# Patient Record
Sex: Female | Born: 1965 | Race: White | Hispanic: No | Marital: Married | Smoking: Never smoker
Health system: Southern US, Community
[De-identification: ages and names within clinical notes are randomized; demographics above are authoritative.]

## PROBLEM LIST (undated history)

## (undated) DIAGNOSIS — E039 Hypothyroidism, unspecified: Secondary | ICD-10-CM

## (undated) DIAGNOSIS — E559 Vitamin D deficiency, unspecified: Secondary | ICD-10-CM

## (undated) DIAGNOSIS — M1990 Unspecified osteoarthritis, unspecified site: Secondary | ICD-10-CM

## (undated) DIAGNOSIS — J45909 Unspecified asthma, uncomplicated: Secondary | ICD-10-CM

## (undated) HISTORY — DX: Vitamin D deficiency, unspecified: E55.9

## (undated) HISTORY — DX: Hypothyroidism, unspecified: E03.9

---

## 2010-12-23 HISTORY — PX: BREAST BIOPSY: SHX20

## 2015-08-25 ENCOUNTER — Other Ambulatory Visit: Payer: Self-pay | Admitting: Medical

## 2015-08-25 DIAGNOSIS — N644 Mastodynia: Secondary | ICD-10-CM

## 2015-09-25 ENCOUNTER — Ambulatory Visit
Admission: RE | Admit: 2015-09-25 | Discharge: 2015-09-25 | Disposition: A | Discharge status: Home / Self Care | Payer: BLUE CROSS/BLUE SHIELD | Source: Ambulatory Visit | Attending: Medical | Admitting: Medical

## 2015-09-25 DIAGNOSIS — N63 Unspecified lump in breast: Secondary | ICD-10-CM | POA: Insufficient documentation

## 2015-09-25 DIAGNOSIS — N644 Mastodynia: Secondary | ICD-10-CM | POA: Diagnosis not present

## 2015-12-31 ENCOUNTER — Emergency Department (HOSPITAL_COMMUNITY)
Admission: EM | Admit: 2015-12-31 | Discharge: 2015-12-31 | Disposition: A | Discharge status: Home / Self Care | Payer: BLUE CROSS/BLUE SHIELD | Attending: Emergency Medicine | Admitting: Emergency Medicine

## 2015-12-31 ENCOUNTER — Encounter (HOSPITAL_COMMUNITY): Payer: Self-pay | Admitting: Emergency Medicine

## 2015-12-31 ENCOUNTER — Emergency Department (HOSPITAL_COMMUNITY): Payer: BLUE CROSS/BLUE SHIELD

## 2015-12-31 DIAGNOSIS — Z8739 Personal history of other diseases of the musculoskeletal system and connective tissue: Secondary | ICD-10-CM | POA: Insufficient documentation

## 2015-12-31 DIAGNOSIS — R079 Chest pain, unspecified: Secondary | ICD-10-CM | POA: Insufficient documentation

## 2015-12-31 DIAGNOSIS — J45909 Unspecified asthma, uncomplicated: Secondary | ICD-10-CM | POA: Insufficient documentation

## 2015-12-31 DIAGNOSIS — J069 Acute upper respiratory infection, unspecified: Secondary | ICD-10-CM

## 2015-12-31 HISTORY — DX: Unspecified osteoarthritis, unspecified site: M19.90

## 2015-12-31 HISTORY — DX: Unspecified asthma, uncomplicated: J45.909

## 2015-12-31 LAB — CBC WITH DIFFERENTIAL/PLATELET
BASOS ABS: 0 10*3/uL (ref 0.0–0.1)
Basophils Relative: 0 %
Eosinophils Absolute: 0.2 10*3/uL (ref 0.0–0.7)
Eosinophils Relative: 7 %
HCT: 37.6 % (ref 36.0–46.0)
HEMOGLOBIN: 12.4 g/dL (ref 12.0–15.0)
LYMPHS ABS: 0.6 10*3/uL — AB (ref 0.7–4.0)
LYMPHS PCT: 24 %
MCH: 31.1 pg (ref 26.0–34.0)
MCHC: 33 g/dL (ref 30.0–36.0)
MCV: 94.2 fL (ref 78.0–100.0)
Monocytes Absolute: 0.4 10*3/uL (ref 0.1–1.0)
Monocytes Relative: 15 %
NEUTROS PCT: 54 %
Neutro Abs: 1.2 10*3/uL — ABNORMAL LOW (ref 1.7–7.7)
PLATELETS: 206 10*3/uL (ref 150–400)
RBC: 3.99 MIL/uL (ref 3.87–5.11)
RDW: 12.1 % (ref 11.5–15.5)
WBC: 2.3 10*3/uL — AB (ref 4.0–10.5)

## 2015-12-31 LAB — I-STAT CHEM 8, ED
BUN: 6 mg/dL (ref 6–20)
CALCIUM ION: 1.13 mmol/L (ref 1.12–1.23)
Chloride: 101 mmol/L (ref 101–111)
Creatinine, Ser: 0.6 mg/dL (ref 0.44–1.00)
Glucose, Bld: 112 mg/dL — ABNORMAL HIGH (ref 65–99)
HEMATOCRIT: 38 % (ref 36.0–46.0)
Hemoglobin: 12.9 g/dL (ref 12.0–15.0)
POTASSIUM: 3.9 mmol/L (ref 3.5–5.1)
Sodium: 137 mmol/L (ref 135–145)
TCO2: 25 mmol/L (ref 0–100)

## 2015-12-31 LAB — COMPREHENSIVE METABOLIC PANEL
ALBUMIN: 3.8 g/dL (ref 3.5–5.0)
ALT: 18 U/L (ref 14–54)
AST: 21 U/L (ref 15–41)
Alkaline Phosphatase: 55 U/L (ref 38–126)
Anion gap: 11 (ref 5–15)
BILIRUBIN TOTAL: 0.5 mg/dL (ref 0.3–1.2)
BUN: 6 mg/dL (ref 6–20)
CHLORIDE: 100 mmol/L — AB (ref 101–111)
CO2: 25 mmol/L (ref 22–32)
CREATININE: 0.58 mg/dL (ref 0.44–1.00)
Calcium: 8.8 mg/dL — ABNORMAL LOW (ref 8.9–10.3)
GFR calc Af Amer: >60 mL/min (ref >60)
GLUCOSE: 116 mg/dL — AB (ref 65–99)
POTASSIUM: 3.9 mmol/L (ref 3.5–5.1)
Sodium: 136 mmol/L (ref 135–145)
Total Protein: 6.4 g/dL — ABNORMAL LOW (ref 6.5–8.1)

## 2015-12-31 LAB — I-STAT TROPONIN, ED: TROPONIN I, POC: 0 ng/mL (ref 0.00–0.08)

## 2015-12-31 LAB — D-DIMER, QUANTITATIVE (NOT AT ARMC)

## 2015-12-31 MED ORDER — IBUPROFEN 800 MG PO TABS: 800.0000 mg | ORAL_TABLET | Freq: Once | ORAL | Status: DC

## 2015-12-31 NOTE — Discharge Instructions (Signed)
Upper Respiratory Infection, Adult Most upper respiratory infections (URIs) are caused by a virus. A URI affects the nose, throat, and upper air passages. The most common type of URI is often called "the common cold." HOME CARE   Take medicines only as told by your doctor.  Gargle warm saltwater or take cough drops to comfort your throat as told by your doctor.  Use a warm mist humidifier or inhale steam from a shower to increase air moisture. This may make it easier to breathe.  Drink enough fluid to keep your pee (urine) clear or pale yellow.  Eat soups and other clear broths.  Have a healthy diet.  Rest as needed.  Go back to work when your fever is gone or your doctor says it is okay.  You may need to stay home longer to avoid giving your URI to others.  You can also wear a face mask and wash your hands often to prevent spread of the virus.  Use your inhaler more if you have asthma.  Do not use any tobacco products, including cigarettes, chewing tobacco, or electronic cigarettes. If you need help quitting, ask your doctor. GET HELP IF:  You are getting worse, not better.  Your symptoms are not helped by medicine.  You have chills.  You are getting more short of breath.  You have brown or red mucus.  You have yellow or brown discharge from your nose.  You have pain in your face, especially when you bend forward.  You have a fever.  You have puffy (swollen) neck glands.  You have pain while swallowing.  You have white areas in the back of your throat. GET HELP RIGHT AWAY IF:   You have very bad or constant:  Headache.  Ear pain.  Pain in your forehead, behind your eyes, and over your cheekbones (sinus pain).  Chest pain.  You have long-lasting (chronic) lung disease and any of the following:  Wheezing.  Long-lasting cough.  Coughing up blood.  A change in your usual mucus.  You have a stiff neck.  You have changes in  your:  Vision.  Hearing.  Thinking.  Mood. MAKE SURE YOU:   Understand these instructions.  Will watch your condition.  Will get help right away if you are not doing well or get worse.   This information is not intended to replace advice given to you by your health care provider. Make sure you discuss any questions you have with your health care provider.   Document Released: 05/27/2008 Document Revised: 04/25/2015 Document Reviewed: 03/16/2014 Elsevier Interactive Patient Education 2016 Elsevier Inc. Nonspecific Chest Pain It is often hard to find the cause of chest pain. There is always a chance that your pain could be related to something serious, such as a heart attack or a blood clot in your lungs. Chest pain can also be caused by conditions that are not life-threatening. If you have chest pain, it is very important to follow up with your doctor.  HOME CARE  If you were prescribed an antibiotic medicine, finish it all even if you start to feel better.  Avoid any activities that cause chest pain.  Do not use any tobacco products, including cigarettes, chewing tobacco, or electronic cigarettes. If you need help quitting, ask your doctor.  Do not drink alcohol.  Take medicines only as told by your doctor.  Keep all follow-up visits as told by your doctor. This is important. This includes any further testing if your  chest pain does not go away.  Your doctor may tell you to keep your head raised (elevated) while you sleep.  Make lifestyle changes as told by your doctor. These may include:  Getting regular exercise. Ask your doctor to suggest some activities that are safe for you.  Eating a heart-healthy diet. Your doctor or a diet specialist (dietitian) can help you to learn healthy eating options.  Maintaining a healthy weight.  Managing diabetes, if necessary.  Reducing stress. GET HELP IF:  Your chest pain does not go away, even after treatment.  You have a  rash with blisters on your chest.  You have a fever. GET HELP RIGHT AWAY IF:  Your chest pain is worse.  You have an increasing cough, or you cough up blood.  You have severe belly (abdominal) pain.  You feel extremely weak.  You pass out (faint).  You have chills.  You have sudden, unexplained chest discomfort.  You have sudden, unexplained discomfort in your arms, back, neck, or jaw.  You have shortness of breath at any time.  You suddenly start to sweat, or your skin gets clammy.  You feel nauseous.  You vomit.  You suddenly feel light-headed or dizzy.  Your heart begins to beat quickly, or it feels like it is skipping beats. These symptoms may be an emergency. Do not wait to see if the symptoms will go away. Get medical help right away. Call your local emergency services (911 in the [place] Do not drive yourself to the hospital.   This information is not intended to replace advice given to you by your health care provider. Make sure you discuss any questions you have with your health care provider.   Document Released: 05/27/2008 Document Revised: 12/30/2014 Document Reviewed: 07/15/2014 Elsevier Interactive Patient Education [employer]2016 Elsevier Inc.

## 2015-12-31 NOTE — ED Notes (Signed)
Pt reports headache, URI s/s with fever Wednesday, reports CP beginning Saturday.  Pt reports pain across front of chest, not worse with movement or coughing.  Pt denies N/V, LOC, dizziness.  Dr. Rosalia Hammersay at bedside.

## 2015-12-31 NOTE — ED Provider Notes (Signed)
CSN: [id number]     Arrival date & time 12/31/15  1023 History   First MD Initiated Contact with Patient 12/31/15 1037     Chief Complaint  Patient presents with  . Chest Pain  . URI  . Cough     (Consider location/radiation/quality/duration/timing/severity/associated sxs/prior Treatment) HPI  50 year old female previously healthy presents today complaining of cough and chest pain. She began having upper respiratory infection symptoms 4 days ago that began with nasal congestion and sore throat. The next day be she began having some cough and had a mildly elevated temperature at 99. Cough has been nonproductive and she has not been dyspneic. She has not had nausea vomiting or diarrhea but has had a decreased appetite. She has been taking in liquids without difficulty. Urination has been normal. She is not a smoker. She has not had a flu shot. She was recently on an overseas flight after visiting family for the holidays. She states that both her legs were swollen afterwards but has not noticed any lateralized swelling. The initial swelling after the flight resolved. She has no history of DVT or PE.  Past Medical History  Diagnosis Date  . Arthritis   . Asthma    Past Surgical History  Procedure Laterality Date  . Breast biopsy Bilateral 2012    benign   No family history on file. Social History  Substance Use Topics  . Smoking status: Never Smoker   . Smokeless tobacco: Never Used  . Alcohol Use: 4.2 oz/week    7 Glasses of wine per week   OB History    No data available     Review of Systems  All other systems reviewed and are negative.     Allergies  Review of patient's allergies indicates not on file.  Home Medications   Prior to Admission medications   Not on File   BP 119/77 mmHg  Pulse 101  Temp(Src) 98.9 F (37.2 C) (Oral)  Resp 12  Ht 5\' 4"  (1.626 m)  Wt 54.432 kg  BMI 20.59 kg/m2  SpO2 99%  LMP 11/14/2015 (Approximate) Physical Exam  Constitutional:  She is oriented to person, place, and time. She appears well-developed and well-nourished.  HENT:  Head: Normocephalic and atraumatic.  Right Ear: External ear normal.  Left Ear: External ear normal.  Nose: Nose normal.  Mouth/Throat: Oropharynx is clear and moist.  Eyes: Conjunctivae and EOM are normal. Pupils are equal, round, and reactive to light.  Neck: Normal range of motion. Neck supple.  Cardiovascular: Normal rate, regular rhythm, normal heart sounds and intact distal pulses.   Pulmonary/Chest: Effort normal and breath sounds normal. She exhibits no tenderness.  Abdominal: Soft. Bowel sounds are normal.  Musculoskeletal: Normal range of motion. She exhibits no edema or tenderness.  Neurological: She is alert and oriented to person, place, and time.  Skin: Skin is warm and dry.  Psychiatric: She has a normal mood and affect. Her behavior is normal. Judgment and thought content normal.  Nursing note and vitals reviewed.   ED Course  Procedures (including critical care time) Labs Review Labs Reviewed  CBC WITH DIFFERENTIAL/PLATELET  D-DIMER, QUANTITATIVE (NOT AT [facility])  COMPREHENSIVE METABOLIC PANEL  I-STAT TROPOININ, ED    Imaging Review Dg Chest 2 View  12/31/2015  CLINICAL DATA:  Patient with dry cough and chest pain. EXAM: CHEST  2 VIEW COMPARISON:  None. FINDINGS: Normal cardiac and mediastinal contours. No consolidative pulmonary opacities. No pleural effusion or pneumothorax. Regional skeleton is unremarkable.  IMPRESSION: No active cardiopulmonary disease. Electronically Signed   By: Annia Beltrew  Davis M.D.   On: 12/31/2015 11:37   I have personally reviewed and evaluated these images and lab results as part of my medical decision-making.   EKG Interpretation   Date/Time:  Sunday December 31 2015 10:32:43 EST Ventricular Rate:  97 PR Interval:  116 QRS Duration: 94 QT Interval:  341 QTC Calculation: 433 R Axis:   90 Text Interpretation:  Normal sinus rhythm  Non-specific ST-t changes  Confirmed by Ailah Barna MD, Jozi Malachi ([id number]) on 12/31/2015 10:38:05 AM      MDM   Final diagnoses:  URI (upper respiratory infection)  Chest pain, unspecified chest pain type    50 year old female with URI symptoms. Chest x-Cielo Arias shows no evidence of acute infiltrate. Labs are normal including a normal d-dimer and troponin. EKG shows no evidence of acute ischemia. Patient has had the pain with extended episodes of up to 8 hours for greater than 24 hours. Given that the pain is associated with her bronchitis symptoms and she has had an extended episode of pain with normal troponin, doubt that this is cardiac in etiology. She was also recently on a long trip and she had a d-dimer checked. This is negative. Patient is given Protonix and ibuprofen. I we have discussed return precautions and need for close follow-up and she voices understanding.    Margarita Grizzleanielle Cassell [place]ies, MD 12/31/15 1236

## 2016-07-04 DIAGNOSIS — S91111A Laceration without foreign body of right great toe without damage to nail, initial encounter: Secondary | ICD-10-CM | POA: Diagnosis not present

## 2016-07-15 ENCOUNTER — Other Ambulatory Visit: Payer: Self-pay | Admitting: Internal Medicine

## 2016-07-15 ENCOUNTER — Ambulatory Visit
Admission: RE | Admit: 2016-07-15 | Discharge: 2016-07-15 | Disposition: A | Discharge status: Home / Self Care | Payer: BLUE CROSS/BLUE SHIELD | Source: Ambulatory Visit | Attending: Internal Medicine | Admitting: Internal Medicine

## 2016-07-15 DIAGNOSIS — M25561 Pain in right knee: Secondary | ICD-10-CM | POA: Diagnosis not present

## 2016-07-15 DIAGNOSIS — M25559 Pain in unspecified hip: Secondary | ICD-10-CM | POA: Diagnosis not present

## 2016-07-15 DIAGNOSIS — G8929 Other chronic pain: Secondary | ICD-10-CM | POA: Diagnosis not present

## 2016-07-15 DIAGNOSIS — M25549 Pain in joints of unspecified hand: Secondary | ICD-10-CM | POA: Diagnosis not present

## 2016-07-15 DIAGNOSIS — M25552 Pain in left hip: Secondary | ICD-10-CM | POA: Diagnosis not present

## 2016-07-15 DIAGNOSIS — M179 Osteoarthritis of knee, unspecified: Secondary | ICD-10-CM | POA: Diagnosis not present

## 2016-07-31 ENCOUNTER — Other Ambulatory Visit (HOSPITAL_COMMUNITY)
Admission: RE | Admit: 2016-07-31 | Discharge: 2016-07-31 | Disposition: A | Discharge status: Home / Self Care | Payer: BLUE CROSS/BLUE SHIELD | Source: Ambulatory Visit | Attending: Internal Medicine | Admitting: Internal Medicine

## 2016-07-31 ENCOUNTER — Other Ambulatory Visit: Payer: Self-pay | Admitting: Internal Medicine

## 2016-07-31 DIAGNOSIS — Z0000 Encounter for general adult medical examination without abnormal findings: Secondary | ICD-10-CM | POA: Diagnosis not present

## 2016-07-31 DIAGNOSIS — Z1151 Encounter for screening for human papillomavirus (HPV): Secondary | ICD-10-CM | POA: Diagnosis not present

## 2016-07-31 DIAGNOSIS — Z01419 Encounter for gynecological examination (general) (routine) without abnormal findings: Secondary | ICD-10-CM | POA: Diagnosis not present

## 2016-07-31 DIAGNOSIS — E039 Hypothyroidism, unspecified: Secondary | ICD-10-CM | POA: Diagnosis not present

## 2016-08-05 LAB — CYTOLOGY - PAP

## 2016-08-22 DIAGNOSIS — J4520 Mild intermittent asthma, uncomplicated: Secondary | ICD-10-CM | POA: Diagnosis not present

## 2016-08-22 DIAGNOSIS — R0982 Postnasal drip: Secondary | ICD-10-CM | POA: Diagnosis not present

## 2016-09-13 DIAGNOSIS — E039 Hypothyroidism, unspecified: Secondary | ICD-10-CM | POA: Diagnosis not present

## 2016-10-08 DIAGNOSIS — L659 Nonscarring hair loss, unspecified: Secondary | ICD-10-CM | POA: Diagnosis not present

## 2016-10-08 DIAGNOSIS — E559 Vitamin D deficiency, unspecified: Secondary | ICD-10-CM | POA: Diagnosis not present

## 2016-10-08 DIAGNOSIS — E039 Hypothyroidism, unspecified: Secondary | ICD-10-CM | POA: Diagnosis not present

## 2016-10-22 ENCOUNTER — Ambulatory Visit (INDEPENDENT_AMBULATORY_CARE_PROVIDER_SITE_OTHER): Payer: BLUE CROSS/BLUE SHIELD | Admitting: Endocrinology

## 2016-10-22 ENCOUNTER — Encounter: Payer: Self-pay | Admitting: Endocrinology

## 2016-10-22 DIAGNOSIS — E559 Vitamin D deficiency, unspecified: Secondary | ICD-10-CM

## 2016-10-22 DIAGNOSIS — E039 Hypothyroidism, unspecified: Secondary | ICD-10-CM | POA: Diagnosis not present

## 2016-10-22 DIAGNOSIS — L659 Nonscarring hair loss, unspecified: Secondary | ICD-10-CM | POA: Diagnosis not present

## 2016-10-22 NOTE — Progress Notes (Signed)
Subjective:    Patient ID: Virginia Fowler, female    DOB: August 21, 1966, 50 y.o.   MRN: [id number]  HPI Pt reports hypothyroidism was dx'ed in mid-2017, on a routine physical.  she was prescribed synthroid 75/d.  she has never taken kelp or any other type of non-prescribed thyroid product.  she has never had thyroid imaging.  She has never had thyroid surgery, or XRT to the neck.  she has never been on amiodarone or lithium.  Since on the synthroid, pt developed moderate hair loss from the head, and assoc anxiety.  The hair loss has persisted, despite other sxs improving.  In early Oct, 2017, she stopped the medication altogether, then resumed at 50 mcg/d.  She says in general, she feels better on 50/d than 75/d.   Past Medical History:  Diagnosis Date  . Arthritis   . Asthma   . Hypothyroidism   . Vitamin D deficiency     Past Surgical History:  Procedure Laterality Date  . BREAST BIOPSY Bilateral 2012   benign    Social History   Social History  . Marital status: Married    Spouse name: N/A  . Number of children: N/A  . Years of education: N/A   Occupational History  . Not on file.   Social History Main Topics  . Smoking status: Never Smoker  . Smokeless tobacco: Never Used  . Alcohol use 4.2 oz/week    7 Glasses of wine per week  . Drug use: No  . Sexual activity: Not on file   Other Topics Concern  . Not on file   Social History Narrative  . No narrative on file    No current outpatient prescriptions on file prior to visit.   No current facility-administered medications on file prior to visit.     No Known Allergies  Family History  Problem Relation Age of Onset  . Thyroid disease Neg Hx     BP 102/64   Pulse 98   Ht 5' 4.5" (1.638 m)   Wt 117 lb (53.1 kg)   SpO2 96%   BMI 19.77 kg/m    Review of Systems denies depression, muscle cramps, sob, weight gain, numbness, blurry vision, cold intolerance, dry skin, rhinorrhea, and syncope.  She has  intermittent palpitations, easy bruising, and difficulty forming words.  She has chronic alternating constipation and diarrhea.      Objective:   Physical Exam VS: see vs page GEN: no distress HEAD: head: no deformity eyes: no periorbital swelling, no proptosis external nose and ears are normal mouth: no lesion seen NECK: supple, thyroid is not enlarged CHEST WALL: no deformity LUNGS: clear to auscultation CV: reg rate and rhythm, no murmur ABD: abdomen is soft, nontender.  no hepatosplenomegaly.  not distended.  no hernia MUSCULOSKELETAL: muscle bulk and strength are grossly normal.  no obvious joint swelling.  gait is normal and steady EXTEMITIES: Trace bilat leg edema PULSES: no carotid bruit NEURO:  cn 2-12 grossly intact.   readily moves all 4's.  sensation is intact to touch on all 4's SKIN:  Normal texture and temperature.  No rash or suspicious lesion is visible.   NODES:  None palpable at the neck PSYCH: alert, well-oriented.  Does not appear anxious nor depressed.   I have reviewed outside records, and summarized: Pt was noted to have hypothyroidism.  It was noted that pt had a response in her TSH from 63-2.3, with supplementation.  She did not tolerate 75 mcg/d (  hair loss)  outside test results are reviewed: TSH=63 (07/31/16 TSH=2.6 (09/13/16)     Assessment & Plan:  Primary hypothyroidism, new to me.   Hair loss, perceived due to synthroid.  We discussed the fact that this is a coincidence (or due to hypothyroidism itself).    Patient is advised the following: Patient Instructions  It is fine with me to stay on the 50 size pill for now blood tests are requested for you today.  Please have done, here in the office, in 2-3 weeks.  We'll let you know about the results. The tests we have suggest that 75 may be the amount needed to normalize your blood tests.  However, you don't have to rush to get there.

## 2016-10-22 NOTE — Patient Instructions (Addendum)
It is fine with me to stay on the 50 size pill for now blood tests are requested for you today.  Please have done, here in the office, in 2-3 weeks.  We'll let you know about the results. The tests we have suggest that 75 may be the amount needed to normalize your blood tests.  However, you don't have to rush to get there.

## 2016-10-24 ENCOUNTER — Encounter: Payer: Self-pay | Admitting: Endocrinology

## 2016-10-24 DIAGNOSIS — E559 Vitamin D deficiency, unspecified: Secondary | ICD-10-CM | POA: Insufficient documentation

## 2016-11-08 ENCOUNTER — Other Ambulatory Visit (INDEPENDENT_AMBULATORY_CARE_PROVIDER_SITE_OTHER): Payer: BLUE CROSS/BLUE SHIELD

## 2016-11-08 DIAGNOSIS — L659 Nonscarring hair loss, unspecified: Secondary | ICD-10-CM

## 2016-11-08 DIAGNOSIS — E039 Hypothyroidism, unspecified: Secondary | ICD-10-CM

## 2016-11-08 LAB — IBC PANEL
IRON: 92 ug/dL (ref 42–145)
Saturation Ratios: 23.8 % (ref 20.0–50.0)
Transferrin: 276 mg/dL (ref 212.0–360.0)

## 2016-11-08 LAB — T4, FREE: FREE T4: 1.07 ng/dL (ref 0.60–1.60)

## 2016-11-08 LAB — TSH: TSH: 1.73 u[IU]/mL (ref 0.35–4.50)

## 2017-01-16 DIAGNOSIS — E039 Hypothyroidism, unspecified: Secondary | ICD-10-CM | POA: Diagnosis not present

## 2017-01-16 DIAGNOSIS — J4520 Mild intermittent asthma, uncomplicated: Secondary | ICD-10-CM | POA: Diagnosis not present

## 2017-09-03 ENCOUNTER — Other Ambulatory Visit (HOSPITAL_COMMUNITY)
Admission: RE | Admit: 2017-09-03 | Discharge: 2017-09-03 | Disposition: A | Discharge status: Home / Self Care | Payer: BLUE CROSS/BLUE SHIELD | Source: Ambulatory Visit | Attending: Internal Medicine | Admitting: Internal Medicine

## 2017-09-03 ENCOUNTER — Other Ambulatory Visit: Payer: Self-pay | Admitting: Internal Medicine

## 2017-09-03 DIAGNOSIS — Z0000 Encounter for general adult medical examination without abnormal findings: Secondary | ICD-10-CM | POA: Diagnosis not present

## 2017-09-03 DIAGNOSIS — Z124 Encounter for screening for malignant neoplasm of cervix: Secondary | ICD-10-CM | POA: Diagnosis not present

## 2017-09-03 DIAGNOSIS — E559 Vitamin D deficiency, unspecified: Secondary | ICD-10-CM | POA: Diagnosis not present

## 2017-09-03 DIAGNOSIS — Z136 Encounter for screening for cardiovascular disorders: Secondary | ICD-10-CM | POA: Diagnosis not present

## 2017-09-05 LAB — CYTOLOGY - PAP
Chlamydia: NEGATIVE
Diagnosis: NEGATIVE
NEISSERIA GONORRHEA: NEGATIVE

## 2017-11-17 ENCOUNTER — Other Ambulatory Visit: Payer: Self-pay | Admitting: Internal Medicine

## 2017-11-17 DIAGNOSIS — Z1231 Encounter for screening mammogram for malignant neoplasm of breast: Secondary | ICD-10-CM

## 2017-12-11 ENCOUNTER — Ambulatory Visit
Admission: RE | Admit: 2017-12-11 | Discharge: 2017-12-11 | Disposition: A | Discharge status: Home / Self Care | Payer: BLUE CROSS/BLUE SHIELD | Source: Ambulatory Visit | Attending: Internal Medicine | Admitting: Internal Medicine

## 2017-12-11 DIAGNOSIS — Z1231 Encounter for screening mammogram for malignant neoplasm of breast: Secondary | ICD-10-CM | POA: Diagnosis not present

## 2018-03-02 ENCOUNTER — Encounter: Payer: Self-pay | Admitting: Medical

## 2018-03-02 ENCOUNTER — Ambulatory Visit: Payer: Self-pay | Admitting: Medical

## 2018-03-02 VITALS — BP 129/71 | HR 86 | Temp 98.3°F | Resp 16 | Ht 64.0 in | Wt 115.6 lb

## 2018-03-02 DIAGNOSIS — R05 Cough: Secondary | ICD-10-CM

## 2018-03-02 DIAGNOSIS — J069 Acute upper respiratory infection, unspecified: Secondary | ICD-10-CM

## 2018-03-02 DIAGNOSIS — R059 Cough, unspecified: Secondary | ICD-10-CM

## 2018-03-02 MED ORDER — BENZONATATE 100 MG PO CAPS: 100.0000 mg | ORAL_CAPSULE | Freq: Three times a day (TID) | ORAL | 0 refills | Status: DC | PRN

## 2018-03-02 MED ORDER — AMOXICILLIN-POT CLAVULANATE 875-125 MG PO TABS: 1.0000 | ORAL_TABLET | Freq: Two times a day (BID) | ORAL | 0 refills | Status: DC

## 2018-03-02 NOTE — Patient Instructions (Signed)
up[place] Cough, Adult A cough helps to clear your throat and lungs. A cough may last only 2-3 weeks (acute), or it may last longer than 8 weeks (chronic). Many different things can cause a cough. A cough may be a sign of an illness or another medical condition. Follow these instructions at home:  Pay attention to any changes in your cough.  Take medicines only as told by your doctor. ? If you were prescribed an antibiotic medicine, take it as told by your doctor. Do not stop taking it even if you start to feel better. ? Talk with your doctor before you try using a cough medicine.  Drink enough fluid to keep your pee (urine) clear or pale yellow.  If the air is dry, use a cold steam vaporizer or humidifier in your home.  Stay away from things that make you cough at work or at home.  If your cough is worse at night, try using extra pillows to raise your head up higher while you sleep.  Do not smoke, and try not to be around smoke. If you need help quitting, ask your doctor.  Do not have caffeine.  Do not drink alcohol.  Rest as needed. Contact a doctor if:  You have new problems (symptoms).  You cough up yellow fluid (pus).  Your cough does not get better after 2-3 weeks, or your cough gets worse.  Medicine does not help your cough and you are not sleeping well.  You have pain that gets worse or pain that is not helped with medicine.  You have a fever.  You are losing weight and you do not know why.  You have night sweats. Get help right away if:  You cough up blood.  You have trouble breathing.  Your heartbeat is very fast. This information is not intended to replace advice given to you by your health care provider. Make sure you discuss any questions you have with your health care provider. Document Released: 08/22/2011 Document Revised: 05/16/2016 Document Reviewed: 02/15/2015 Elsevier Interactive Patient Education  2018 Elsevier Inc.  Upper Respiratory Infection,  Adult Most upper respiratory infections (URIs) are caused by a virus. A URI affects the nose, throat, and upper air passages. The most common type of URI is often called "the common cold." Follow these instructions at home:  Take medicines only as told by your doctor.  Gargle warm saltwater or take cough drops to comfort your throat as told by your doctor.  Use a warm mist humidifier or inhale steam from a shower to increase air moisture. This may make it easier to breathe.  Drink enough fluid to keep your pee (urine) clear or pale yellow.  Eat soups and other clear broths.  Have a healthy diet.  Rest as needed.  Go back to work when your fever is gone or your doctor says it is okay. ? You may need to stay home longer to avoid giving your URI to others. ? You can also wear a face mask and wash your hands often to prevent spread of the virus.  Use your inhaler more if you have asthma.  Do not use any tobacco products, including cigarettes, chewing tobacco, or electronic cigarettes. If you need help quitting, ask your doctor. Contact a doctor if:  You are getting worse, not better.  Your symptoms are not helped by medicine.  You have chills.  You are getting more short of breath.  You have brown or red mucus.  You have  yellow or brown discharge from your nose.  You have pain in your face, especially when you bend forward.  You have a fever.  You have puffy (swollen) neck glands.  You have pain while swallowing.  You have white areas in the back of your throat. Get help right away if:  You have very bad or constant: ? Headache. ? Ear pain. ? Pain in your forehead, behind your eyes, and over your cheekbones (sinus pain). ? Chest pain.  You have long-lasting (chronic) lung disease and any of the following: ? Wheezing. ? Long-lasting cough. ? Coughing up blood. ? A change in your usual mucus.  You have a stiff neck.  You have changes in  your: ? Vision. ? Hearing. ? Thinking. ? Mood. This information is not intended to replace advice given to you by your health care provider. Make sure you discuss any questions you have with your health care provider. Document Released: 05/27/2008 Document Revised: 08/11/2016 Document Reviewed: 03/16/2014 Elsevier Interactive Patient Education  2018 [employer].

## 2018-03-02 NOTE — Progress Notes (Signed)
   Subjective:    Patient ID: Virginia Fowler, female    DOB: [birth year], 52 y.o.   MRN: [id number]  HPI 52 yo female non acute distress started one week ago possible got sick from sick husband. Cough non productive. Nasal congestion. Not runny.  Lymphnodes tender and swollen in neck.  Soreness in chest "frome coughing"t and easr clogged bilaterally. Seems like it has gotten worse.   Review of Systems  Constitutional: Positive for fatigue. Negative for chills and fever.  HENT: Positive for congestion, hearing loss and sore throat. Negative for ear pain, postnasal drip, rhinorrhea, sinus pressure and sinus pain.   Respiratory: Positive for cough. Negative for chest tightness, shortness of breath and wheezing.   Cardiovascular: Negative for chest pain (sore from coughing so much).  Gastrointestinal: Negative for abdominal pain.  Genitourinary: Negative for dysuria.  Musculoskeletal: Negative for myalgias.  Skin: Negative for pallor.  Allergic/Immunologic: Positive for environmental allergies. Negative for food allergies.  Neurological: Negative for dizziness, syncope, light-headedness and headaches.  Hematological: Positive for adenopathy.  Psychiatric/Behavioral: Negative for behavioral problems and suicidal ideas. The patient is not nervous/anxious and is not hyperactive.        Objective:   Physical Exam  Constitutional: She is oriented to person, place, and time. She appears well-developed and well-nourished.  HENT:  Head: Normocephalic and atraumatic.  Right Ear: Hearing, external ear and ear canal normal. A middle ear effusion is present.  Left Ear: Hearing, external ear and ear canal normal. A middle ear effusion is present.  Nose: Rhinorrhea present.  Mouth/Throat: Uvula is midline and mucous membranes are normal. Posterior oropharyngeal erythema (mild) present.  Eyes: Conjunctivae and EOM are normal. Pupils are equal, round, and reactive to light.  Neck: Normal range of  motion. Neck supple.  Cardiovascular: Normal rate, regular rhythm and normal heart sounds.  Pulmonary/Chest: Effort normal and breath sounds normal.  Lymphadenopathy:    She has cervical adenopathy.  Neurological: She is alert and oriented to person, place, and time.  Skin: Skin is warm and dry.  Psychiatric: She has a normal mood and affect. Her behavior is normal. Judgment and thought content normal.  Nursing note and vitals reviewed.   Cough noted in room.    Assessment & Plan:  Upper Respiratory Infection/ Cough/ viral sinusitis [place]. Meds ordered this encounter  Medications  . benzonatate (TESSALON PERLES) 100 MG capsule    Sig: Take 1 capsule (100 mg total) by mouth 3 (three) times daily as needed for cough.    Dispense:  30 capsule    Refill:  0  . amoxicillin-clavulanate (AUGMENTIN) 875-125 MG tablet    Sig: Take 1 tablet by mouth 2 (two) times daily.    Dispense:  20 tablet    Refill:  0  return in 5 days if not improving. If worsening to [place] medical [place] in [place]. Patient verbalizes understanding and has no questions at discharge.

## 2018-03-20 DIAGNOSIS — K635 Polyp of colon: Secondary | ICD-10-CM | POA: Diagnosis not present

## 2018-03-20 DIAGNOSIS — Z1211 Encounter for screening for malignant neoplasm of colon: Secondary | ICD-10-CM | POA: Diagnosis not present

## 2018-03-20 DIAGNOSIS — D126 Benign neoplasm of colon, unspecified: Secondary | ICD-10-CM | POA: Diagnosis not present

## 2018-03-20 DIAGNOSIS — K5730 Diverticulosis of large intestine without perforation or abscess without bleeding: Secondary | ICD-10-CM | POA: Diagnosis not present

## 2018-03-24 DIAGNOSIS — K635 Polyp of colon: Secondary | ICD-10-CM | POA: Diagnosis not present

## 2018-03-24 DIAGNOSIS — D126 Benign neoplasm of colon, unspecified: Secondary | ICD-10-CM | POA: Diagnosis not present

## 2018-03-24 DIAGNOSIS — Z1211 Encounter for screening for malignant neoplasm of colon: Secondary | ICD-10-CM | POA: Diagnosis not present

## 2018-09-22 DIAGNOSIS — Z0000 Encounter for general adult medical examination without abnormal findings: Secondary | ICD-10-CM | POA: Diagnosis not present

## 2018-09-22 DIAGNOSIS — Z23 Encounter for immunization: Secondary | ICD-10-CM | POA: Diagnosis not present

## 2018-09-22 DIAGNOSIS — Z136 Encounter for screening for cardiovascular disorders: Secondary | ICD-10-CM | POA: Diagnosis not present

## 2018-09-22 DIAGNOSIS — Z789 Other specified health status: Secondary | ICD-10-CM | POA: Diagnosis not present

## 2018-09-22 DIAGNOSIS — E039 Hypothyroidism, unspecified: Secondary | ICD-10-CM | POA: Diagnosis not present

## 2018-09-28 DIAGNOSIS — Z780 Asymptomatic menopausal state: Secondary | ICD-10-CM | POA: Diagnosis not present

## 2018-09-28 DIAGNOSIS — M810 Age-related osteoporosis without current pathological fracture: Secondary | ICD-10-CM | POA: Diagnosis not present

## 2018-12-04 ENCOUNTER — Other Ambulatory Visit: Payer: Self-pay | Admitting: Internal Medicine

## 2018-12-04 DIAGNOSIS — Z1231 Encounter for screening mammogram for malignant neoplasm of breast: Secondary | ICD-10-CM

## 2018-12-17 DIAGNOSIS — M25552 Pain in left hip: Secondary | ICD-10-CM | POA: Diagnosis not present

## 2018-12-17 DIAGNOSIS — M6281 Muscle weakness (generalized): Secondary | ICD-10-CM | POA: Diagnosis not present

## 2018-12-22 DIAGNOSIS — M6281 Muscle weakness (generalized): Secondary | ICD-10-CM | POA: Diagnosis not present

## 2018-12-22 DIAGNOSIS — M25552 Pain in left hip: Secondary | ICD-10-CM | POA: Diagnosis not present

## 2018-12-24 DIAGNOSIS — M25552 Pain in left hip: Secondary | ICD-10-CM | POA: Diagnosis not present

## 2018-12-24 DIAGNOSIS — M6281 Muscle weakness (generalized): Secondary | ICD-10-CM | POA: Diagnosis not present

## 2019-01-05 DIAGNOSIS — M6281 Muscle weakness (generalized): Secondary | ICD-10-CM | POA: Diagnosis not present

## 2019-01-05 DIAGNOSIS — M25552 Pain in left hip: Secondary | ICD-10-CM | POA: Diagnosis not present

## 2019-01-07 ENCOUNTER — Ambulatory Visit
Admission: RE | Admit: 2019-01-07 | Discharge: 2019-01-07 | Disposition: A | Discharge status: Home / Self Care | Payer: BLUE CROSS/BLUE SHIELD | Source: Ambulatory Visit | Attending: Internal Medicine | Admitting: Internal Medicine

## 2019-01-07 DIAGNOSIS — Z1231 Encounter for screening mammogram for malignant neoplasm of breast: Secondary | ICD-10-CM

## 2019-01-07 DIAGNOSIS — M6281 Muscle weakness (generalized): Secondary | ICD-10-CM | POA: Diagnosis not present

## 2019-01-07 DIAGNOSIS — M25552 Pain in left hip: Secondary | ICD-10-CM | POA: Diagnosis not present

## 2019-01-12 DIAGNOSIS — M25552 Pain in left hip: Secondary | ICD-10-CM | POA: Diagnosis not present

## 2019-01-12 DIAGNOSIS — M6281 Muscle weakness (generalized): Secondary | ICD-10-CM | POA: Diagnosis not present

## 2019-01-21 DIAGNOSIS — M6281 Muscle weakness (generalized): Secondary | ICD-10-CM | POA: Diagnosis not present

## 2019-01-21 DIAGNOSIS — M25552 Pain in left hip: Secondary | ICD-10-CM | POA: Diagnosis not present

## 2019-01-28 DIAGNOSIS — M25552 Pain in left hip: Secondary | ICD-10-CM | POA: Diagnosis not present

## 2019-01-28 DIAGNOSIS — M6281 Muscle weakness (generalized): Secondary | ICD-10-CM | POA: Diagnosis not present

## 2019-04-07 MED FILL — LEVOTHYROXINE SODIUM 50 MCG: 50 | 90 days supply | Qty: 90 | Fill #0

## 2019-07-07 ENCOUNTER — Emergency Department: Payer: BC Managed Care – PPO

## 2019-07-07 ENCOUNTER — Encounter: Payer: Self-pay | Admitting: Emergency Medicine

## 2019-07-07 ENCOUNTER — Telehealth: Payer: Self-pay

## 2019-07-07 ENCOUNTER — Other Ambulatory Visit: Payer: Self-pay

## 2019-07-07 ENCOUNTER — Emergency Department
Admission: EM | Admit: 2019-07-07 | Discharge: 2019-07-07 | Disposition: A | Discharge status: Home / Self Care | Payer: BC Managed Care – PPO | Attending: Emergency Medicine | Admitting: Emergency Medicine

## 2019-07-07 DIAGNOSIS — J45909 Unspecified asthma, uncomplicated: Secondary | ICD-10-CM | POA: Insufficient documentation

## 2019-07-07 DIAGNOSIS — E039 Hypothyroidism, unspecified: Secondary | ICD-10-CM | POA: Insufficient documentation

## 2019-07-07 DIAGNOSIS — M25512 Pain in left shoulder: Secondary | ICD-10-CM | POA: Diagnosis not present

## 2019-07-07 DIAGNOSIS — Z79899 Other long term (current) drug therapy: Secondary | ICD-10-CM | POA: Diagnosis not present

## 2019-07-07 MED ORDER — IBUPROFEN 400 MG PO TABS: 400.0000 mg | ORAL_TABLET | Freq: Four times a day (QID) | ORAL | 0 refills | Status: AC | PRN

## 2019-07-07 MED ORDER — CYCLOBENZAPRINE HCL 5 MG PO TABS: ORAL_TABLET | ORAL | 0 refills | Status: DC

## 2019-07-07 MED ORDER — KETOROLAC TROMETHAMINE 30 MG/ML IJ SOLN
30.0000 mg | Freq: Once | INTRAMUSCULAR | Status: AC
  Administered 2019-07-07: 17:00:00 30 mg via INTRAMUSCULAR
  Filled 2019-07-07: qty 1

## 2019-07-07 MED ORDER — LIDOCAINE 5 % EX PTCH: 1.0000 | MEDICATED_PATCH | CUTANEOUS | 0 refills | Status: DC

## 2019-07-07 NOTE — ED Provider Notes (Signed)
[facility] Emergency Department Provider Note  ____________________________________________  Time seen: Approximately 4:05 PM  I have reviewed the triage vital signs and the nursing notes.   HISTORY  Chief Complaint Shoulder Pain    HPI Virginia Fowler is a 53 y.o. female that presents to the emergency department for evaluation of left shoulder pain for about 6 hours. Pain starts in her left shoulder and occasionally radiates into left upper arm. She has had a couple episodes of numbness to her left fingers over the 4th and 5th digits.  She had to carry her purse with her right arm due to pain to that left shoulder.  She took 200mg  of advil today with relief. Pain was an 8/10 before advil and 3/10 after advil. No injuries.   Past Medical History:  Diagnosis Date  . Arthritis   . Asthma   . Hypothyroidism   . Vitamin D deficiency     Patient Active Problem List   Diagnosis Date Noted  . Vitamin D deficiency   . Hair loss 10/22/2016  . Hypothyroidism     Past Surgical History:  Procedure Laterality Date  . BREAST BIOPSY Bilateral 2012   benign    Prior to Admission medications   Medication Sig Start Date End Date Taking? Authorizing Provider  amoxicillin-clavulanate (AUGMENTIN) 875-125 MG tablet Take 1 tablet by mouth 2 (two) times daily. 03/02/18   Ratcliffe, Heather R, PA-C  benzonatate (TESSALON PERLES) 100 MG capsule Take 1 capsule (100 mg total) by mouth 3 (three) times daily as needed for cough. 03/02/18   Ratcliffe, Estill Dooms, PA-C  cyclobenzaprine (FLEXERIL) 5 MG tablet Take 1-2 tablets 3 times daily as needed 07/07/19   Laban Emperor, PA-C  ibuprofen (ADVIL) 400 MG tablet Take 1 tablet (400 mg total) by mouth every 6 (six) hours as needed. 07/07/19   Laban Emperor, PA-C  levothyroxine (SYNTHROID, LEVOTHROID) 50 MCG tablet Take 1 tablet by mouth daily. 01/07/18   [provider]  lidocaine (LIDODERM) 5 % Place 1 patch onto the skin  daily. Remove & Discard patch within 12 hours or as directed by MD 07/07/19   Laban Emperor, PA-C    Allergies Patient has no known allergies.  Family History  Problem Relation Age of Onset  . Thyroid disease Neg Hx     Social History Social History   Tobacco Use  . Smoking status: Never Smoker  . Smokeless tobacco: Never Used  Substance Use Topics  . Alcohol use: Yes    Alcohol/week: 7.0 standard drinks    Types: 7 Glasses of wine per week  . Drug use: No     Review of Systems  Constitutional: No fever/chills Cardiovascular: No chest pain. Respiratory: No cough. No SOB. Gastrointestinal: No abdominal pain.  No nausea, no vomiting.  Musculoskeletal: Negative for musculoskeletal pain. Skin: Negative for rash, abrasions, lacerations, ecchymosis. Neurological: Negative for headaches, numbness or tingling   ____________________________________________   PHYSICAL EXAM:  VITAL SIGNS: ED Triage Vitals  Enc Vitals Group     BP 07/07/19 1510 (!) 148/79     Pulse Rate 07/07/19 1510 94     Resp 07/07/19 1510 18     Temp 07/07/19 1510 98.9 F (37.2 C)     Temp Source 07/07/19 1510 Oral     SpO2 07/07/19 1510 99 %     Weight 07/07/19 1511 115 lb (52.2 kg)     Height 07/07/19 1511 5\' 4"  (1.626 m)     Head Circumference --  Peak Flow --      Pain Score 07/07/19 1511 4     Pain Loc --      Pain Edu? --      Excl. in GC? --      Constitutional: Alert and oriented. Well appearing and in no acute distress. Eyes: Conjunctivae are normal. PERRL. EOMI. Head: Atraumatic. ENT:      Ears:      Nose: No congestion/rhinnorhea.      Mouth/Throat: Mucous membranes are moist.  Neck: No stridor.  No cervical spine tenderness to palpation. Full ROM to  Cardiovascular: Normal rate, regular rhythm.  Good peripheral circulation. Respiratory: Normal respiratory effort without tachypnea or retractions. Lungs CTAB. Good air entry to the bases with no decreased or absent breath  sounds. Musculoskeletal: Full range of motion to all extremities. No gross deformities appreciated.  Tenderness to palpation to anterior left shoulder.  Full range of motion of shoulder with minimal pain.  Strength equal in upper extremities bilaterally. Neurologic:  Normal speech and language. No gross focal neurologic deficits are appreciated.  Skin:  Skin is warm, dry and intact. No rash noted. Psychiatric: Mood and affect are normal. Speech and behavior are normal. Patient exhibits appropriate insight and judgement.   ____________________________________________   LABS (all labs ordered are listed, but only abnormal results are displayed)  Labs Reviewed - No data to display ____________________________________________  EKG   ____________________________________________  RADIOLOGY Lexine BatonI, Ho Parisi, personally viewed and evaluated these images (plain radiographs) as part of my medical decision making, as well as reviewing the written report by the radiologist.  Dg Shoulder Left  Result Date: 07/07/2019 CLINICAL DATA:  Left shoulder pain beginning this morning. Pain radiates to the left arm and hand. No known injury. EXAM: LEFT SHOULDER - 2+ VIEW COMPARISON:  None. FINDINGS: There is no evidence of fracture or dislocation. There is no evidence of arthropathy or other focal bone abnormality. Soft tissues are unremarkable. IMPRESSION: Negative. Electronically Signed   By: Amie Portlandavid  Ormond M.D.   On: 07/07/2019 16:14    ____________________________________________    PROCEDURES  Procedure(s) performed:    Procedures    Medications  ketorolac (TORADOL) 30 MG/ML injection 30 mg (30 mg Intramuscular Given 07/07/19 1655)     ____________________________________________   INITIAL IMPRESSION / ASSESSMENT AND PLAN / ED COURSE  Pertinent labs & imaging results that were available during my care of the patient were reviewed by me and considered in my medical decision making (see  chart for details).  Review of the [place] CSRS was performed in accordance of the [facility] prior to dispensing any controlled drugs.   Patient's diagnosis is consistent with shoulder pain with radiculopathy.  Vital signs and exam are reassuring.  Shoulder x-ray negative for acute bony abnormalities.  Exam is reassuring.  Pain is reproducible with palpation.  Patient will be discharged home with prescriptions for Motrin and Flexeril. Patient is to follow up with primary care as directed. Patient is given ED precautions to return to the ED for any worsening or new symptoms.  Virginia Fowler was evaluated in Emergency Department on 07/07/2019 for the symptoms described in the history of present illness. She was evaluated in the context of the global COVID-19 pandemic, which necessitated consideration that the patient might be at risk for infection with the SARS-CoV-2 virus that causes COVID-19. Institutional protocols and algorithms that pertain to the evaluation of patients at risk for COVID-19 are in a state of rapid change based on information released  by regulatory bodies including the CDC and federal and state organizations. These policies and algorithms were followed during the patient's care in the ED.   ____________________________________________  FINAL CLINICAL IMPRESSION(S) / ED DIAGNOSES  Final diagnoses:  Acute pain of left shoulder      NEW MEDICATIONS STARTED DURING THIS VISIT:  ED Discharge Orders         Ordered    ibuprofen (ADVIL) 400 MG tablet  Every 6 hours PRN     07/07/19 1701    cyclobenzaprine (FLEXERIL) 5 MG tablet     07/07/19 1701    lidocaine (LIDODERM) 5 %  Every 24 hours     07/07/19 1701              This chart was dictated using voice recognition software/Dragon. Despite best efforts to proofread, errors can occur which can change the meaning. Any change was purely unintentional.    Enid DerryWagner, Idriss Quackenbush, PA-C 07/07/19 2030    Sharyn CreamerQuale, Mark, MD 07/07/19  2110

## 2019-07-07 NOTE — Discharge Instructions (Addendum)
Your symptoms are consistent with some inflammation in one of the nerves that go down your arm and into your hand.  Please take ibuprofen for inflammation and Flexeril to help relax your muscles.  You can apply Lidoderm patch to the front of your shoulder where it is most sore.  Please also use heat.  Nly take Flexeril at night so that you can still drive and go to work.

## 2019-07-07 NOTE — Telephone Encounter (Signed)
Patient calls our office c/o pain in her left shoulder blade, numbness in her fingers and nausea that started this morning.  She took Advil 400 mg and then came to work.  The pain had gotten better but has now returned with the numbness.  Instructed patient to report to the emergency room asap .  Paient agrees to go promptly and she will be taken there by a coworker.

## 2019-07-07 NOTE — ED Triage Notes (Signed)
Pt c/o L shoulder pain starting this morning. Pain radiates down L arm into hand and has gotten worse since this morning. Denies known injury.

## 2019-08-31 DIAGNOSIS — Z1159 Encounter for screening for other viral diseases: Secondary | ICD-10-CM | POA: Diagnosis not present

## 2019-10-06 ENCOUNTER — Other Ambulatory Visit: Payer: Self-pay | Admitting: Internal Medicine

## 2019-10-06 ENCOUNTER — Other Ambulatory Visit (HOSPITAL_COMMUNITY)
Admission: RE | Admit: 2019-10-06 | Discharge: 2019-10-06 | Disposition: A | Discharge status: Home / Self Care | Payer: BC Managed Care – PPO | Source: Ambulatory Visit | Attending: Internal Medicine | Admitting: Internal Medicine

## 2019-10-06 DIAGNOSIS — M810 Age-related osteoporosis without current pathological fracture: Secondary | ICD-10-CM | POA: Diagnosis not present

## 2019-10-06 DIAGNOSIS — Z01419 Encounter for gynecological examination (general) (routine) without abnormal findings: Secondary | ICD-10-CM | POA: Diagnosis not present

## 2019-10-06 DIAGNOSIS — E039 Hypothyroidism, unspecified: Secondary | ICD-10-CM | POA: Diagnosis not present

## 2019-10-06 DIAGNOSIS — Z0000 Encounter for general adult medical examination without abnormal findings: Secondary | ICD-10-CM | POA: Diagnosis not present

## 2019-10-06 DIAGNOSIS — Z13220 Encounter for screening for lipoid disorders: Secondary | ICD-10-CM | POA: Diagnosis not present

## 2019-10-06 DIAGNOSIS — Z23 Encounter for immunization: Secondary | ICD-10-CM | POA: Diagnosis not present

## 2019-10-13 LAB — CYTOLOGY - PAP
Comment: NEGATIVE
Diagnosis: NEGATIVE
High risk HPV: NEGATIVE

## 2019-10-18 DIAGNOSIS — M5412 Radiculopathy, cervical region: Secondary | ICD-10-CM | POA: Diagnosis not present

## 2019-10-20 ENCOUNTER — Ambulatory Visit: Payer: Self-pay

## 2019-11-15 ENCOUNTER — Other Ambulatory Visit: Payer: Self-pay | Admitting: Internal Medicine

## 2019-11-15 DIAGNOSIS — Z1231 Encounter for screening mammogram for malignant neoplasm of breast: Secondary | ICD-10-CM

## 2019-11-24 ENCOUNTER — Encounter: Payer: Self-pay | Admitting: Physical Therapy

## 2019-11-24 ENCOUNTER — Ambulatory Visit: Payer: BC Managed Care – PPO | Attending: Internal Medicine | Admitting: Physical Therapy

## 2019-11-24 ENCOUNTER — Other Ambulatory Visit: Payer: Self-pay

## 2019-11-24 DIAGNOSIS — M5412 Radiculopathy, cervical region: Secondary | ICD-10-CM | POA: Insufficient documentation

## 2019-11-24 NOTE — Therapy (Signed)
[place], [place], [address] Phone: [phone]   Fax:  [phone]  Physical Therapy Evaluation  Patient Details  Name: Virginia Fowler MRN: [id number] Date of Birth: [birth year] Referring Provider (PT): Leeroy Cha, MD   Encounter Date: 11/24/2019  PT End of Session - 11/24/19 2024    Visit Number  1    Number of Visits  8    Date for PT Re-Evaluation  01/05/20    Authorization Type  BCBS    PT Start Time  1630    PT Stop Time  1717    PT Time Calculation (min)  47 min    Activity Tolerance  Patient tolerated treatment well    Behavior During Therapy  [facility] for tasks assessed/performed       Past Medical History:  Diagnosis Date  . Arthritis   . Asthma   . Hypothyroidism   . Vitamin D deficiency     Past Surgical History:  Procedure Laterality Date  . BREAST BIOPSY Bilateral 2012   benign    There were no vitals filed for this visit.   Subjective Assessment - 11/24/19 2007    Subjective  Pt. reports sudden onset of left shoulder pain 6 months ago-no mechanism of injury noted. She went to ED with cervical radicular etiology suspected for symptoms. Pain and symptoms have persisted with primary complaint of left shoulder pain with intermittent radiating pain and parasthesias distally down left arm into hand.    Pertinent History  no other signiifcant PMH noted    Limitations  Sitting    Diagnostic tests  X-rays    Patient Stated Goals  Resolve pain    Currently in Pain?  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    Pain Relieving Factors  no specific eases noted     Effect of Pain on Daily Activities  impacts positional tolerance for work duties         [facility] PT Assessment - 11/24/19 0001      Assessment   Medical Diagnosis  Cervical radiculopathy    Referring Provider (PT)  Leeroy Cha, MD    Onset Date/Surgical Date  07/07/19    Hand Dominance  Left    Prior Therapy  none      Precautions   Precautions  None      Restrictions   Weight Bearing Restrictions  No      Balance Screen   Has the patient fallen in the past 6 months  No      Prior Function   Level of Independence  Independent    Vocation  Full time employment    [employer] professor      Cognition   Overall Cognitive Status  Within Functional Limits for tasks assessed      Observation/Other Assessments   Focus on Therapeutic Outcomes (FOTO)   --   to be assessed next visit     Sensation   Light Touch  Appears Intact   C5-T1  dermatomes     Posture/Postural Control   Posture Comments  Mild rounding of shoulders      ROM / Strength   AROM / PROM / Strength  AROM;Strength      AROM   Overall AROM Comments  Bilat. shoulder AROM grossly WNL, no symptom reproduction with shoulder ROM noted, for cervical region no signiifcant directional preference noted    AROM Assessment Site  Cervical    Cervical Flexion  47    Cervical Extension  38    Cervical - Right Side Bend  25    Cervical - Left Side Bend  30    Cervical - Right Rotation  65    Cervical - Left Rotation  65      Strength   Overall Strength Comments  Bilat. UE grossly 5/5, left grip 50 lbs., right grip 45 lbs. with grip dynamonometer      Flexibility   Soft Tissue Assessment /Muscle Length  --   tight left upper trapezius     Special Tests   Other special tests  (+) Spurling's on left, (-) ULTT for median, ulnar and radial nerve, Mild ease of symptoms with cervical distraction but limited radicular pain at start of test, (+) empty can on left                 Objective measurements completed on examination: See above findings.      [facility] Adult PT Treatment/Exercise - 11/24/19 0001      Exercises   Exercises  Neck      Neck Exercises: Seated   Other Seated Exercise  brief HEP instruction and practice cervical retractions, left upper trap and posterior shoulder stretches, Theraband bilat. ER (issued red Theraband for HEP)       Trigger Point Dry Needling - 11/24/19 0001    Consent Given?  Yes    Education Handout Provided  Yes    Muscles Treated Head and Neck  Upper trapezius    Muscles Treated Upper Quadrant  Infraspinatus    Dry Needling Comments  needling performed in right sidelying with 32 gauge 30 mm needle           PT Education - 11/24/19 2023    Education Details  spine anatomy, potential symptom etiology, HEP, dry needling    Person(s) Educated  Patient    Methods  Explanation;Demonstration;Verbal cues;Handout    Comprehension  Verbalized understanding;Returned demonstration          PT Long Term Goals - 11/24/19 2035      PT LONG TERM GOAL #1   Title  Independent with HEP    Baseline  no HEP    Time  6    Period  Weeks    Status  New    Target Date  01/05/20      PT LONG TERM GOAL #2   Title  FOTO outcome measure goal to be determined on assessment next visit    Time  6    Period  Weeks    Status  New    Target Date  01/05/20      PT LONG TERM GOAL #3   Title  Tolerate sitting for computer work periods at least 30-40 minutes without limitation due to left shoulder/radicular pain    Baseline  increased difficulty due to pain    Time  6    Period  Weeks    Status  New    Target Date  01/05/20  PT LONG TERM GOAL #4   Title  Increase bilateral cervical rotation AROM at least 5-10 deg to improve ability to turn head while driving    Baseline  65 deg bilat.    Time  6    Period  Weeks    Status  New    Target Date  01/05/20             Plan - 11/24/19 2025     Clinical Impression Statement  Pt. presents with left shoulder pain with more distal left UE radiating pain and parasthesias into left arm and hand which could be consistent with cervical radiculopathy. (+) Spurling's but difficulty differentiating radicular symptom provocation vs. local muscle pain in left upper trapezius and shoulder region. Suspect potential local shoulder involvement with myofascial component involving posterior rotator cuff and associated referred pain vs. rotator cuff involvement given (+) Empty can. Pt. would benefit from to help relieve pain and address associated functional limitations.    Personal Factors and Comorbidities  Time since onset of injury/illness/exacerbation   differential diagnosis cervical radiculopathy vs. 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 mechanical, check response dry needling, cervical retraction-check for centralization response from HEP, STM left upper trapezius and periscapular region, postural strengthening, stretch upper trap and pecs    PT Home Exercise Plan  cervical retractions, left upper trapezius and posterior shoulder stretches, Theraband bilat. ER    Consulted and Agree with Plan of Care  Patient       Patient will benefit from skilled therapeutic intervention in order to improve the following  deficits and impairments:  Postural dysfunction, Pain, Impaired flexibility, Impaired UE functional use  Visit Diagnosis: Radiculopathy, cervical region     Problem List Patient Active Problem List   Diagnosis Date Noted  . Vitamin D deficiency   . Hair loss 10/22/2016  . Hypothyroidism     Lazarus Gowda, PT, DPT 11/24/19 8:39 PM  [facility] [address] [place], [place], [address] Phone: [phone]   Fax:  [phone]  Name: Larry Knipp MRN: [id number] Date of Birth: [birth year]

## 2019-12-09 ENCOUNTER — Ambulatory Visit: Payer: BC Managed Care – PPO | Admitting: Physical Therapy

## 2019-12-23 ENCOUNTER — Encounter: Payer: Self-pay | Admitting: Physical Therapy

## 2019-12-23 ENCOUNTER — Other Ambulatory Visit: Payer: Self-pay

## 2019-12-23 ENCOUNTER — Ambulatory Visit: Payer: BC Managed Care – PPO | Admitting: Physical Therapy

## 2019-12-23 DIAGNOSIS — M5412 Radiculopathy, cervical region: Secondary | ICD-10-CM | POA: Diagnosis not present

## 2019-12-23 NOTE — Therapy (Signed)
[place], [place], [address] Phone: [phone]   Fax:  [phone]  Physical Therapy Treatment  Patient Details  Name: Virginia Fowler MRN: [id number] Date of Birth: [birth year] Referring Provider (PT): Leeroy Cha, MD   Encounter Date: 12/23/2019  PT End of Session - 12/23/19 1214    Visit Number  2    Number of Visits  8    Date for PT Re-Evaluation  01/05/20    Authorization Type  BCBS    PT Start Time  1110   arrived late, car issue this AM   PT Stop Time  1155    PT Time Calculation (min)  45 min    Activity Tolerance  Patient tolerated treatment well    Behavior During Therapy  [facility] for tasks assessed/performed       Past Medical History:  Diagnosis Date  . Arthritis   . Asthma   . Hypothyroidism   . Vitamin D deficiency     Past Surgical History:  Procedure Laterality Date  . BREAST BIOPSY Bilateral 2012   benign    There were no vitals filed for this visit.  Subjective Assessment - 12/23/19 1207    Subjective  No pain pre-tx. Pt. reports improvement with not having to use desk chair at work recently which positions her neck in slight extension to view monitor. Some soreness after dry needling but then helped with muscular pain. Noting some tightness in left SCM region as well as right upper trapezius.    Currently in Pain?  No/denies         [facility] PT Assessment - 12/23/19 0001      Observation/Other Assessments   Focus on Therapeutic Outcomes (FOTO)   27% limited                   [facility] Adult PT Treatment/Exercise - 12/23/19 0001      Neck Exercises: Theraband   Shoulder Extension  20 reps;Red    Rows  20 reps;Green    Horizontal ABduction  15 reps;Red    Horizontal ABduction Limitations  supine      Neck Exercises: Supine   Neck Retraction  15 reps    Neck Retraction Limitations  with gentle therapist overpressure    Capital Flexion Limitations   supine retraction with assisted flexion x 15 reps      Manual Therapy   Manual Therapy  Soft tissue mobilization;Myofascial release;Manual Traction    Soft tissue mobilization  right upper trapezius region in sitting    Myofascial Release  suboccipital release    Manual Traction  cervical      Neck Exercises: Stretches   Warehouse manager  --   HEP instruction   Other Neck Stretches  supine manual SCM/scalene stretch left side 3x20 seconds    Other Neck Stretches  HEP instruction and brief practice self SCM/scalene stretch with sheet over clavicle             PT Education - 12/23/19 1213    Education Details  HEP updates, ergonomics for work chair and pillow height for sleeping/potential towel roll use, Theracane use for self-trigger point release    Person(s) Educated  Patient    Methods  Explanation;Demonstration;Verbal cues;Handout    Comprehension  Verbalized understanding;Returned demonstration          PT Long Term Goals - 12/23/19 1218      PT LONG TERM GOAL #1   Title  Independent with  HEP    Baseline  updated today    Time  6    Period  Weeks    Status  Achieved      PT LONG TERM GOAL #2   Title  Improve FOTO outcome measure score to 23% or less impairment    Baseline  27% limited    Time  6    Period  Weeks    Status  New    Target Date  01/05/20      PT LONG TERM GOAL #3   Title  Tolerate sitting for computer work periods at least 30-40 minutes without limitation due to left shoulder/radicular pain    Baseline  recent improvement with not having to use work desk-continue goal for status upon return to campus    Time  6    Period  Weeks    Status  On-going      PT LONG TERM GOAL #4   Title  Increase bilateral cervical rotation AROM at least 5-10 deg to improve ability to turn head while driving    Baseline  65 deg bilat.    Time  6    Period  Weeks    Status  On-going            Plan - 12/23/19 1214    Clinical Impression Statement  Good  progress with improvement from baseline status assisted as noted in subjective with not having to use work desk-would suspect tendency cervical extension with desk/monitor set-up could have been exacerbating neck pain and radicular symptoms if possible underlying facet arthropathy vs. stenosis. Updated HEP as noted-pt. will continue with HEP and schedule at least 1 more follow up to check on status in the next several weeks with possible d/c at that point if progress continues.    Personal Factors and Comorbidities  Time since onset of injury/illness/exacerbation    Examination-Activity Limitations  Sit;Lift    Stability/Clinical Decision Making  Evolving/Moderate complexity    Clinical Decision Making  Moderate    Rehab Potential  Good    PT Frequency  --   1-2x/week   PT Duration  6 weeks    PT Treatment/Interventions  ADLs/Self Care Home Management;Cryotherapy;Ultrasound;Traction;Moist Heat;Electrical Stimulation;Therapeutic exercise;Therapeutic activities;Neuromuscular re-education;Patient/family education;Manual techniques;Dry needling;Taping;Spinal Manipulations    PT Next Visit Plan  continue manual vs. mechanical traction, STM cervical/upper trap region, progress postural exercises and stretches as needed, flexion bias cervical ROM    PT Home Exercise Plan  cervical retractions, left upper trapezius and posterior shoulder stretches, Theraband bilat. ER, SCM/scalene stretch, Theraband row, ext and horizontal abduction, doorway pec stretch    Consulted and Agree with Plan of Care  Patient       Patient will benefit from skilled therapeutic intervention in order to improve the following deficits and impairments:  Postural dysfunction, Pain, Impaired flexibility, Impaired UE functional use  Visit Diagnosis: Radiculopathy, cervical region     Problem List Patient Active Problem List   Diagnosis Date Noted  . Vitamin D deficiency   . Hair loss 10/22/2016  . Hypothyroidism      Lazarus Gowda, PT, DPT 12/23/19 12:20 PM  [facility] [address] [place], [place], [address] Phone: [phone]   Fax:  [phone]  Name: Virginia Fowler MRN: [id number] Date of Birth: [birth year]

## 2020-01-11 ENCOUNTER — Ambulatory Visit: Payer: BC Managed Care – PPO

## 2020-01-24 ENCOUNTER — Ambulatory Visit: Payer: BC Managed Care – PPO | Admitting: Physical Therapy

## 2020-01-31 NOTE — Therapy (Signed)
[place], [place], [address] Phone: [phone]   Fax:  [phone]  Physical Therapy Treatment/Discharge  Patient Details  Name: Virginia Fowler MRN: [id number] Date of Birth: [birth year] Referring Provider (PT): Leeroy Cha, MD   Encounter Date: 12/23/2019    Past Medical History:  Diagnosis Date  . Arthritis   . Asthma   . Hypothyroidism   . Vitamin D deficiency     Past Surgical History:  Procedure Laterality Date  . BREAST BIOPSY Bilateral 2012   benign    There were no vitals filed for this visit.                                 PT Long Term Goals - 12/23/19 1218      PT LONG TERM GOAL #1   Title  Independent with HEP    Baseline  updated today    Time  6    Period  Weeks    Status  Achieved      PT LONG TERM GOAL #2   Title  Improve FOTO outcome measure score to 23% or less impairment    Baseline  27% limited    Time  6    Period  Weeks    Status  New    Target Date  01/05/20      PT LONG TERM GOAL #3   Title  Tolerate sitting for computer work periods at least 30-40 minutes without limitation due to left shoulder/radicular pain    Baseline  recent improvement with not having to use work desk-continue goal for status upon return to campus    Time  6    Period  Weeks    Status  On-going      PT LONG TERM GOAL #4   Title  Increase bilateral cervical rotation AROM at least 5-10 deg to improve ability to turn head while driving    Baseline  65 deg bilat.    Time  6    Period  Weeks    Status  On-going              Patient will benefit from skilled therapeutic intervention in order to improve the following deficits and impairments:  Postural dysfunction, Pain, Impaired flexibility, Impaired UE functional use  Visit Diagnosis: Radiculopathy, cervical region     Problem List Patient Active Problem List   Diagnosis Date  Noted  . Vitamin D deficiency   . Hair loss 10/22/2016  . Hypothyroidism         PHYSICAL THERAPY DISCHARGE SUMMARY  Visits from Start of Care: 1  Current functional level related to goals / functional outcomes: Patient did not return for further therapy after last visit 12/22/20-she contacted clinic to cancel last appointment with report of doing well and did not feel further therapy needed. Recommend follow up with MD with any changes in status or if failing to improve with continued HEP.   Remaining deficits: NA   Education / Equipment: HEP Plan: Patient agrees to discharge.  Patient goals were partially met. Patient is being discharged due to the patient's request.  ?????          Beaulah Dinning, PT, DPT 01/31/20 2:40 PM    [facility] [address] [place], [place], [address] Phone: [phone]   Fax:  [phone]  Name: Kenlynn Houde MRN: [id number] Date of  Birth: October 09, 1966

## 2020-02-14 ENCOUNTER — Other Ambulatory Visit: Payer: Self-pay

## 2020-02-14 ENCOUNTER — Ambulatory Visit
Admission: RE | Admit: 2020-02-14 | Discharge: 2020-02-14 | Disposition: A | Discharge status: Home / Self Care | Payer: BC Managed Care – PPO | Source: Ambulatory Visit | Attending: Internal Medicine | Admitting: Internal Medicine

## 2020-02-14 DIAGNOSIS — Z1231 Encounter for screening mammogram for malignant neoplasm of breast: Secondary | ICD-10-CM

## 2020-05-03 DIAGNOSIS — H9011 Conductive hearing loss, unilateral, right ear, with unrestricted hearing on the contralateral side: Secondary | ICD-10-CM | POA: Diagnosis not present

## 2020-05-09 DIAGNOSIS — H903 Sensorineural hearing loss, bilateral: Secondary | ICD-10-CM | POA: Diagnosis not present

## 2020-10-09 ENCOUNTER — Other Ambulatory Visit (HOSPITAL_COMMUNITY): Payer: Self-pay | Admitting: Internal Medicine

## 2020-10-16 DIAGNOSIS — Z0000 Encounter for general adult medical examination without abnormal findings: Secondary | ICD-10-CM | POA: Diagnosis not present

## 2020-10-16 DIAGNOSIS — E039 Hypothyroidism, unspecified: Secondary | ICD-10-CM | POA: Diagnosis not present

## 2020-10-16 DIAGNOSIS — E559 Vitamin D deficiency, unspecified: Secondary | ICD-10-CM | POA: Diagnosis not present

## 2020-10-17 ENCOUNTER — Other Ambulatory Visit (HOSPITAL_COMMUNITY): Payer: Self-pay | Admitting: Internal Medicine

## 2020-10-27 ENCOUNTER — Other Ambulatory Visit: Payer: Self-pay | Admitting: Internal Medicine

## 2020-10-27 DIAGNOSIS — M810 Age-related osteoporosis without current pathological fracture: Secondary | ICD-10-CM

## 2021-02-07 ENCOUNTER — Other Ambulatory Visit: Payer: BC Managed Care – PPO

## 2021-02-10 ENCOUNTER — Other Ambulatory Visit: Payer: BC Managed Care – PPO

## 2021-02-13 ENCOUNTER — Other Ambulatory Visit: Payer: Self-pay | Admitting: Internal Medicine

## 2021-02-13 DIAGNOSIS — Z1231 Encounter for screening mammogram for malignant neoplasm of breast: Secondary | ICD-10-CM

## 2021-03-23 ENCOUNTER — Other Ambulatory Visit (HOSPITAL_COMMUNITY): Payer: Self-pay | Admitting: Internal Medicine

## 2021-03-23 DIAGNOSIS — K588 Other irritable bowel syndrome: Secondary | ICD-10-CM | POA: Diagnosis not present

## 2021-04-09 ENCOUNTER — Other Ambulatory Visit (HOSPITAL_COMMUNITY): Payer: Self-pay

## 2021-04-09 DIAGNOSIS — K588 Other irritable bowel syndrome: Secondary | ICD-10-CM | POA: Diagnosis not present

## 2021-04-09 MED FILL — Levothyroxine Sodium Tab 50 MCG: ORAL | 90 days supply | Qty: 90 | Fill #0 | Status: AC

## 2021-05-11 DIAGNOSIS — E039 Hypothyroidism, unspecified: Secondary | ICD-10-CM | POA: Diagnosis not present

## 2021-05-11 DIAGNOSIS — H9313 Tinnitus, bilateral: Secondary | ICD-10-CM | POA: Diagnosis not present

## 2021-06-26 ENCOUNTER — Other Ambulatory Visit: Payer: Self-pay | Admitting: Internal Medicine

## 2021-06-26 DIAGNOSIS — M810 Age-related osteoporosis without current pathological fracture: Secondary | ICD-10-CM

## 2021-07-02 ENCOUNTER — Other Ambulatory Visit (HOSPITAL_COMMUNITY): Payer: Self-pay

## 2021-07-02 MED FILL — Levothyroxine Sodium Tab 50 MCG: ORAL | 90 days supply | Qty: 90 | Fill #1 | Status: AC

## 2021-07-20 ENCOUNTER — Ambulatory Visit: Payer: BC Managed Care – PPO

## 2021-07-20 ENCOUNTER — Other Ambulatory Visit: Payer: BC Managed Care – PPO

## 2021-08-17 ENCOUNTER — Ambulatory Visit
Admission: RE | Admit: 2021-08-17 | Discharge: 2021-08-17 | Disposition: A | Discharge status: Home / Self Care | Payer: BC Managed Care – PPO | Source: Ambulatory Visit | Attending: Internal Medicine | Admitting: Internal Medicine

## 2021-08-17 ENCOUNTER — Other Ambulatory Visit: Payer: Self-pay

## 2021-08-17 DIAGNOSIS — Z1231 Encounter for screening mammogram for malignant neoplasm of breast: Secondary | ICD-10-CM

## 2021-09-01 ENCOUNTER — Telehealth: Payer: BC Managed Care – PPO | Admitting: Nurse Practitioner

## 2021-09-01 DIAGNOSIS — L03211 Cellulitis of face: Secondary | ICD-10-CM | POA: Diagnosis not present

## 2021-09-01 MED ORDER — SULFAMETHOXAZOLE-TRIMETHOPRIM 800-160 MG PO TABS: 1.0000 | ORAL_TABLET | Freq: Two times a day (BID) | ORAL | 0 refills | Status: DC

## 2021-09-01 NOTE — Progress Notes (Signed)
Virtual Visit Consent   Virginia Fowler, you are scheduled for a virtual visit with Mary-Margaret Daphine Deutscher, FNP, a [facility] provider, today.     Just as with appointments in the office, your consent must be obtained to participate.  Your consent will be active for this visit and any virtual visit you may have with one of our providers in the next 365 days.     If you have a MyChart account, a copy of this consent can be sent to you electronically.  All virtual visits are billed to your insurance company just like a traditional visit in the office.    As this is a virtual visit, video technology does not allow for your provider to perform a traditional examination.  This may limit your provider's ability to fully assess your condition.  If your provider identifies any concerns that need to be evaluated in person or the need to arrange testing (such as labs, EKG, etc.), we will make arrangements to do so.     Although advances in technology are sophisticated, we cannot ensure that it will always work on either your end or our end.  If the connection with a video visit is poor, the visit may have to be switched to a telephone visit.  With either a video or telephone visit, we are not always able to ensure that we have a secure connection.     I need to obtain your verbal consent now.   Are you willing to proceed with your visit today? YES   Jady Braggs has provided verbal consent on 09/01/2021 for a virtual visit (video or telephone).   Mary-Margaret Daphine Deutscher, FNP   Date: 09/01/2021 7:27 PM   Virtual Visit via Video Note   I, Mary-Margaret Daphine Deutscher, connected with Bertina Guthridge ([id number], February 19, 1966) on 09/01/21 at  7:15 PM EDT by a video-enabled telemedicine application and verified that I am speaking with the correct person using two identifiers.  Location: Patient: Virtual Visit Location Patient: Home Provider: Virtual Visit Location Provider: Mobile   I discussed the limitations of  evaluation and management by telemedicine and the availability of in person appointments. The patient expressed understanding and agreed to proceed.    History of Present Illness: Virginia Fowler is a 55 y.o. who identifies as a female who was assigned female at birth, and is being seen today for cellulitis .  HPI: Patient said she  noticed a pimple on her left temple. Has got larger and now she has swelling under her left eyelid. No visual problems.   Problems:  Patient Active Problem List   Diagnosis Date Noted   Vitamin D deficiency    Hair loss 10/22/2016   Hypothyroidism     Allergies: No Known Allergies Medications:  Current Outpatient Medications:    amoxicillin-clavulanate (AUGMENTIN) 875-125 MG tablet, Take 1 tablet by mouth 2 (two) times daily. (Patient not taking: Reported on 11/24/2019), Disp: 20 tablet, Rfl: 0   benzonatate (TESSALON PERLES) 100 MG capsule, Take 1 capsule (100 mg total) by mouth 3 (three) times daily as needed for cough. (Patient not taking: Reported on 11/24/2019), Disp: 30 capsule, Rfl: 0   cyclobenzaprine (FLEXERIL) 5 MG tablet, Take 1-2 tablets 3 times daily as needed (Patient not taking: Reported on 11/24/2019), Disp: 20 tablet, Rfl: 0   dicyclomine (BENTYL) 20 MG tablet, TAKE 1 TABLET BY MOUTH THREE TIMES A DAY AS NEEDED FOR 10 DAYS, Disp: 30 tablet, Rfl: 0   ibuprofen (ADVIL) 400 MG tablet, Take  1 tablet (400 mg total) by mouth every 6 (six) hours as needed., Disp: 30 tablet, Rfl: 0   levothyroxine (SYNTHROID) 50 MCG tablet, TAKE ONE TABLET BY MOUTH EVERY MORNING ON AN EMPTY STOMACH, Disp: 90 tablet, Rfl: 3   levothyroxine (SYNTHROID, LEVOTHROID) 50 MCG tablet, Take 1 tablet by mouth daily., Disp: , Rfl: 2   lidocaine (LIDODERM) 5 %, Place 1 patch onto the skin daily. Remove & Discard patch within 12 hours or as directed by MD (Patient not taking: Reported on 11/24/2019), Disp: 30 patch, Rfl: 0   Vitamin D, Ergocalciferol, (DRISDOL) 1.25 MG (50000 UNIT) CAPS  capsule, TAKE ONE CAPSULE BY MOUTH ONCE A WEEK, Disp: 8 capsule, Rfl: 0  Observations/Objective: Patient is well-developed, well-nourished in no acute distress.  Resting comfortably  at home.  Head is normocephalic, atraumatic.  No labored breathing.  Speech is clear and coherent with logical content.  Patient is alert and oriented at baseline.  Edema under left eyelid Erythematous closed cone dome on left temple about the size of pencil eraser.  Assessment and Plan:  Lexee Brashears in today with chief complaint of Cellulitis   1. Cellulitis, face Ice Do not pick or scratch If swelling worsens tomorrow- need face to face visit for rocephin injection. Meds ordered this encounter  Medications   sulfamethoxazole-trimethoprim (BACTRIM DS) 800-160 MG tablet    Sig: Take 1 tablet by mouth 2 (two) times daily.    Dispense:  20 tablet    Refill:  0    Order Specific Question:   Supervising Provider    Answer:   Eber Hong [[id number]]      Follow Up Instructions: I discussed the assessment and treatment plan with the patient. The patient was provided an opportunity to ask questions and all were answered. The patient agreed with the plan and demonstrated an understanding of the instructions.  A copy of instructions were sent to the patient via MyChart.  The patient was advised to call back or seek an in-person evaluation if the symptoms worsen or if the condition fails to improve as anticipated.  Time:  I spent 10 minutes with the patient via telehealth technology discussing the above problems/concerns.    Mary-Margaret Daphine Deutscher, FNP

## 2021-09-03 ENCOUNTER — Ambulatory Visit: Payer: BC Managed Care – PPO

## 2021-09-03 DIAGNOSIS — R220 Localized swelling, mass and lump, head: Secondary | ICD-10-CM | POA: Diagnosis not present

## 2021-09-03 DIAGNOSIS — R519 Headache, unspecified: Secondary | ICD-10-CM | POA: Diagnosis not present

## 2021-09-03 DIAGNOSIS — R110 Nausea: Secondary | ICD-10-CM | POA: Diagnosis not present

## 2021-09-03 DIAGNOSIS — Z03818 Encounter for observation for suspected exposure to other biological agents ruled out: Secondary | ICD-10-CM | POA: Diagnosis not present

## 2021-10-10 ENCOUNTER — Ambulatory Visit: Payer: Self-pay

## 2021-10-10 ENCOUNTER — Other Ambulatory Visit: Payer: Self-pay

## 2021-10-10 DIAGNOSIS — Z23 Encounter for immunization: Secondary | ICD-10-CM

## 2021-12-07 ENCOUNTER — Ambulatory Visit
Admission: RE | Admit: 2021-12-07 | Discharge: 2021-12-07 | Disposition: A | Discharge status: Home / Self Care | Payer: BC Managed Care – PPO | Source: Ambulatory Visit | Attending: Internal Medicine | Admitting: Internal Medicine

## 2021-12-07 DIAGNOSIS — M810 Age-related osteoporosis without current pathological fracture: Secondary | ICD-10-CM

## 2021-12-07 DIAGNOSIS — M85851 Other specified disorders of bone density and structure, right thigh: Secondary | ICD-10-CM | POA: Diagnosis not present

## 2021-12-07 DIAGNOSIS — Z780 Asymptomatic menopausal state: Secondary | ICD-10-CM | POA: Diagnosis not present

## 2022-01-03 DIAGNOSIS — Z13220 Encounter for screening for lipoid disorders: Secondary | ICD-10-CM | POA: Diagnosis not present

## 2022-01-03 DIAGNOSIS — E039 Hypothyroidism, unspecified: Secondary | ICD-10-CM | POA: Diagnosis not present

## 2022-01-03 DIAGNOSIS — Z0000 Encounter for general adult medical examination without abnormal findings: Secondary | ICD-10-CM | POA: Diagnosis not present

## 2022-01-03 DIAGNOSIS — E559 Vitamin D deficiency, unspecified: Secondary | ICD-10-CM | POA: Diagnosis not present

## 2022-01-09 IMAGING — MG MM DIGITAL SCREENING BILAT W/ TOMO AND CAD
8 series · 9 of 24 positions shown · non-contrast
Comparison: Previous exam(s).

CLINICAL DATA: Screening.

EXAM:
DIGITAL SCREENING BILATERAL MAMMOGRAM WITH TOMOSYNTHESIS AND CAD
TECHNIQUE: Bilateral screening digital craniocaudal and mediolateral oblique
mammograms were obtained. Bilateral screening digital breast
tomosynthesis was performed. The images were evaluated with
computer-aided detection.

[R CC synth-2D]
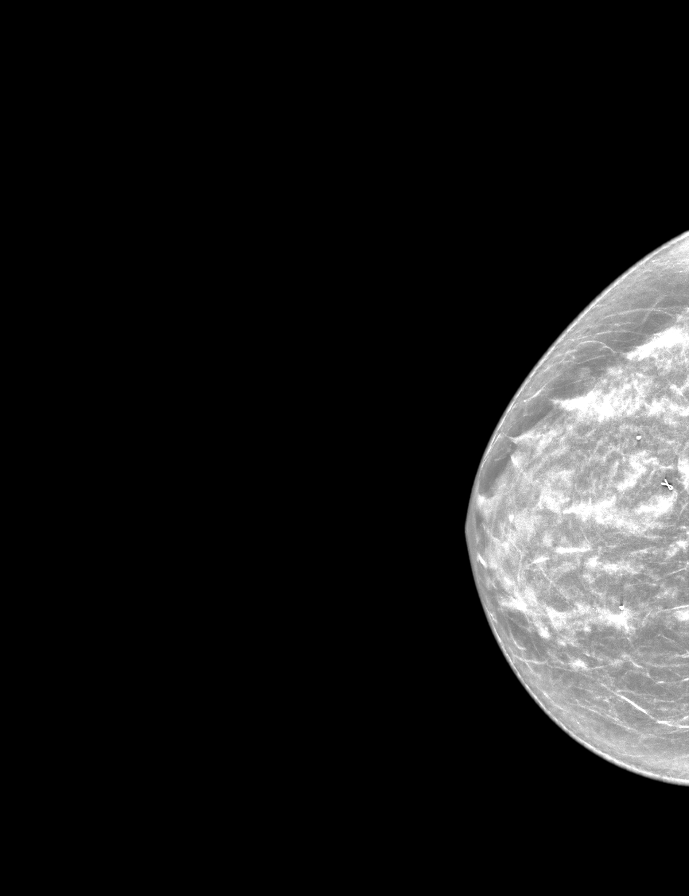

[R MLO synth-2D]
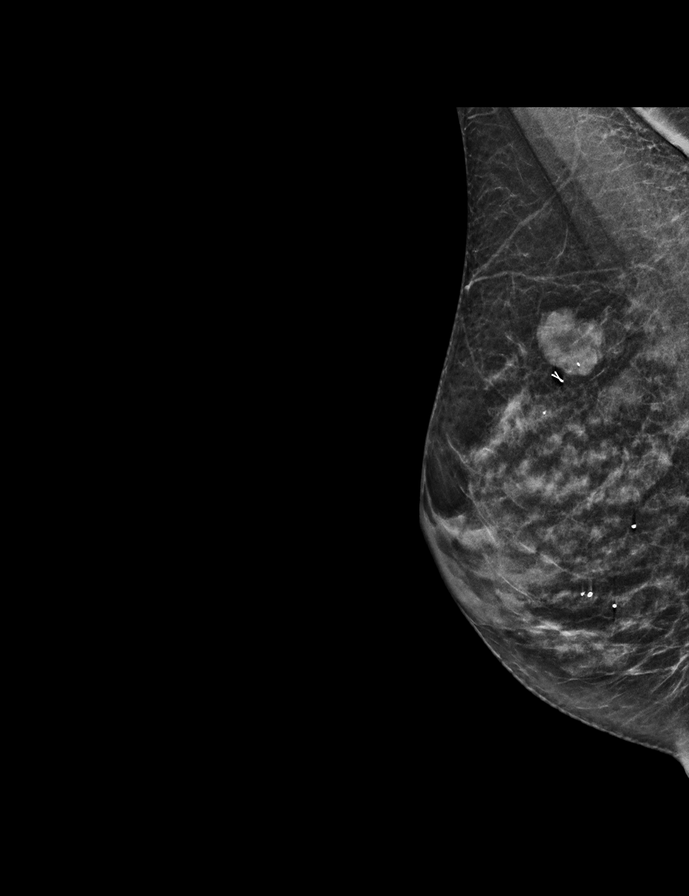

[L MLO synth-2D]
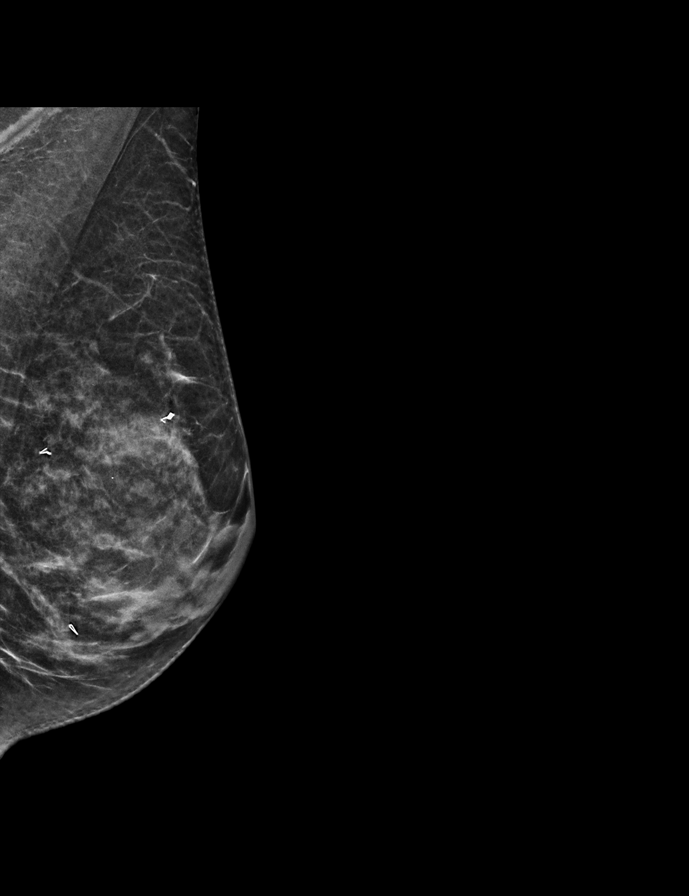

[L CC synth-2D]
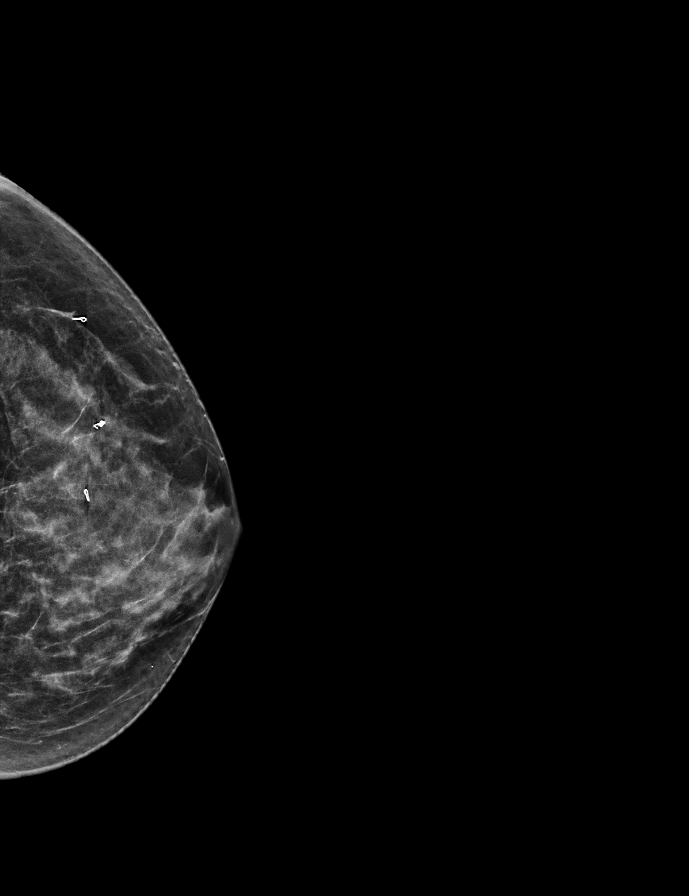

[R CC tomo · 2 of 50 frames shown]
[frame 17/50]
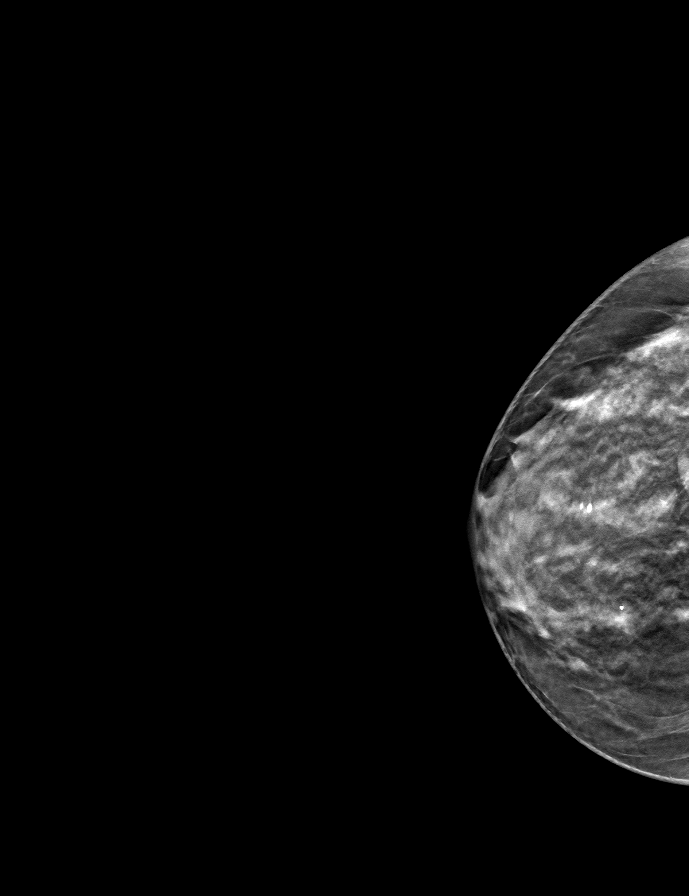
[frame 25/50]
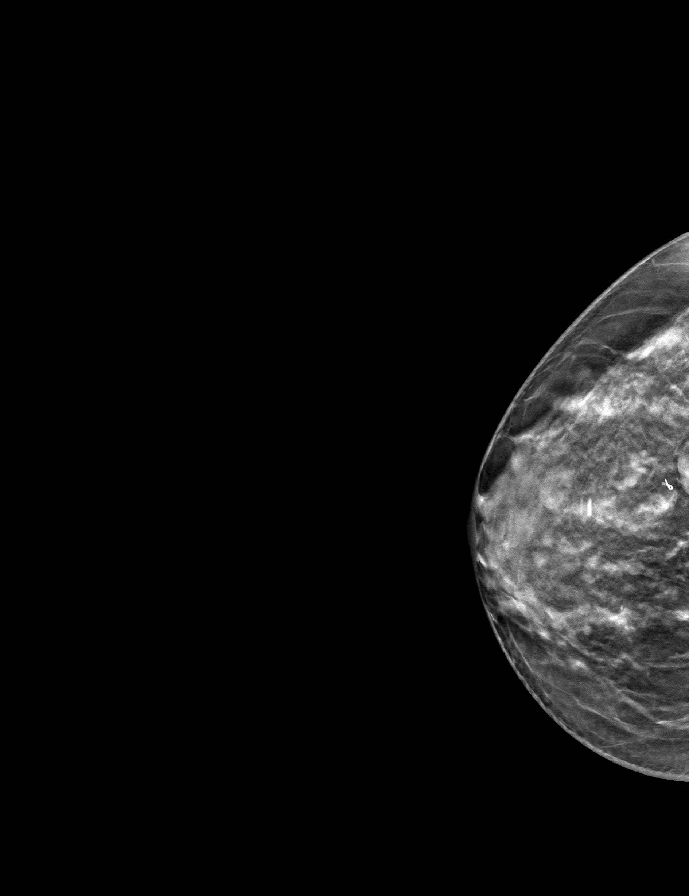

[R MLO tomo · tomo slice 24/47.0]
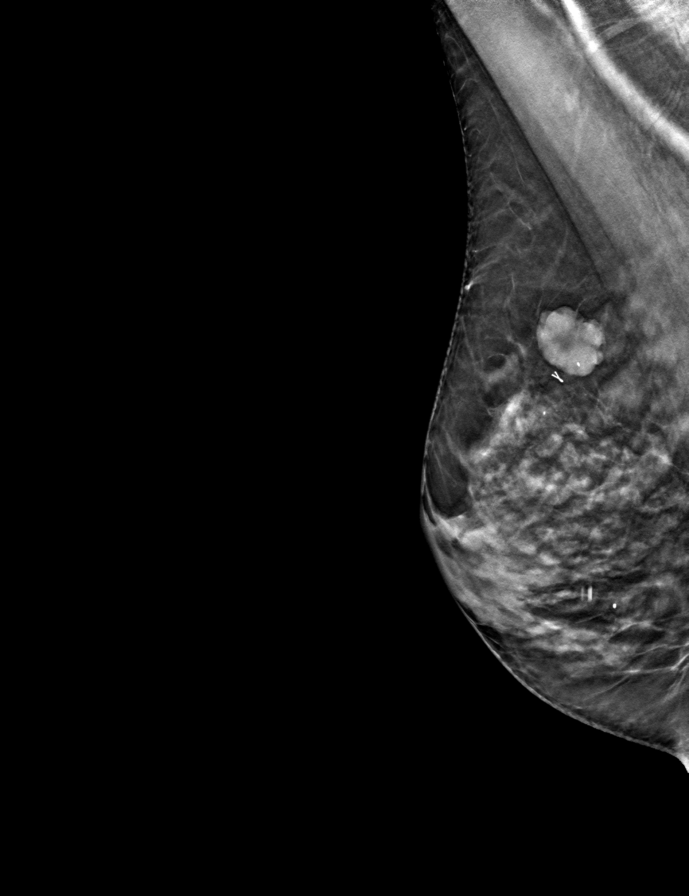

[L CC tomo · tomo slice 25/49.0]
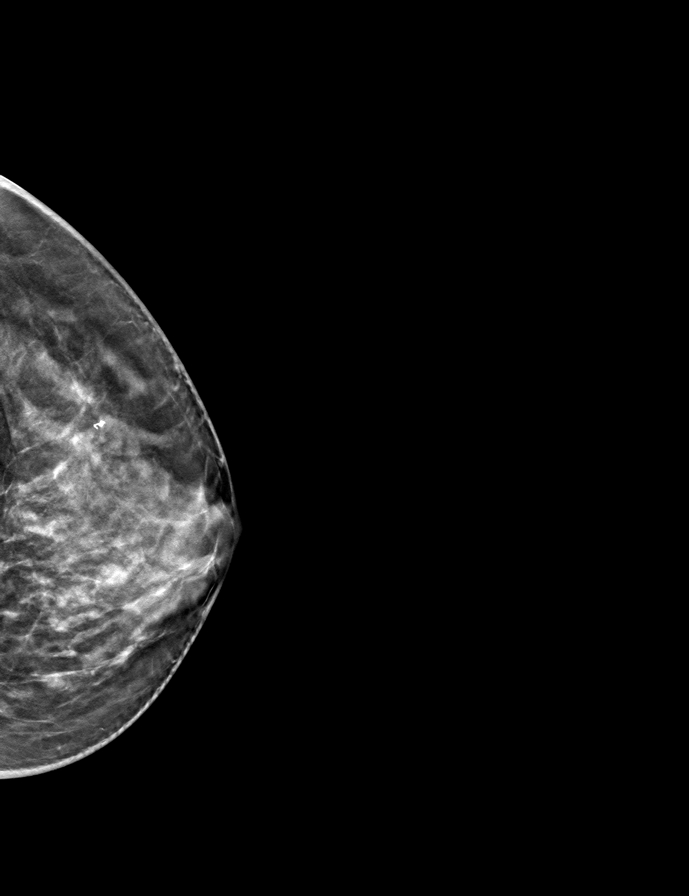

[L MLO tomo · tomo slice 25/49.0]
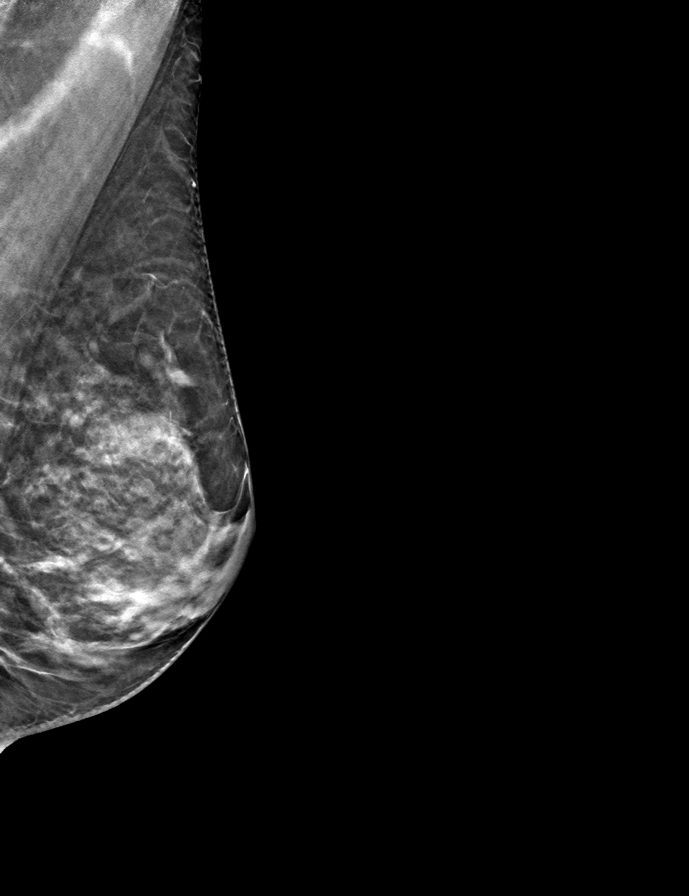

[9 of 24 positions shown; findings below may reference images not displayed]

ACR Breast Density Category c: The breast tissue is heterogeneously
dense, which may obscure small masses.
FINDINGS: There are no findings suspicious for malignancy.
IMPRESSION: No mammographic evidence of malignancy. A result letter of this
screening mammogram will be mailed directly to the patient.

RECOMMENDATION:
Screening mammogram in one year. (Code:Q3-W-BC3)

BI-RADS CATEGORY  1: Negative.

## 2022-06-10 DIAGNOSIS — F5101 Primary insomnia: Secondary | ICD-10-CM | POA: Diagnosis not present

## 2022-06-10 DIAGNOSIS — M810 Age-related osteoporosis without current pathological fracture: Secondary | ICD-10-CM | POA: Diagnosis not present

## 2022-06-10 DIAGNOSIS — N952 Postmenopausal atrophic vaginitis: Secondary | ICD-10-CM | POA: Diagnosis not present

## 2022-09-30 ENCOUNTER — Other Ambulatory Visit: Payer: Self-pay | Admitting: Internal Medicine

## 2022-09-30 DIAGNOSIS — Z1231 Encounter for screening mammogram for malignant neoplasm of breast: Secondary | ICD-10-CM

## 2022-10-08 ENCOUNTER — Ambulatory Visit
Admission: EM | Admit: 2022-10-08 | Discharge: 2022-10-08 | Disposition: A | Discharge status: Home / Self Care | Payer: BC Managed Care – PPO | Attending: Emergency Medicine | Admitting: Emergency Medicine

## 2022-10-08 ENCOUNTER — Ambulatory Visit: Payer: Self-pay

## 2022-10-08 DIAGNOSIS — R1010 Upper abdominal pain, unspecified: Secondary | ICD-10-CM

## 2022-10-08 DIAGNOSIS — R0602 Shortness of breath: Secondary | ICD-10-CM

## 2022-10-08 LAB — POCT URINALYSIS DIP (MANUAL ENTRY)
Bilirubin, UA: NEGATIVE
Glucose, UA: NEGATIVE mg/dL
Ketones, POC UA: NEGATIVE mg/dL
Leukocytes, UA: NEGATIVE
Nitrite, UA: NEGATIVE
Protein Ur, POC: NEGATIVE mg/dL
Spec Grav, UA: 1.01 (ref 1.010–1.025)
Urobilinogen, UA: 0.2 E.U./dL
pH, UA: 5.5 (ref 5.0–8.0)

## 2022-10-08 NOTE — ED Notes (Signed)
Patient is being discharged from the Urgent Care and sent to the Emergency Department via POV . Per Barkley Boards NP, patient is in need of higher level of care due to abdominal pain. Patient is aware and verbalizes understanding of plan of care.  Vitals:   10/08/22 1158  BP: 120/78  Pulse: 88  Resp: 18  Temp: 98.1 F (36.7 C)  SpO2: 98%

## 2022-10-08 NOTE — Discharge Instructions (Addendum)
Go to the emergency department for evaluation of your abdominal pain.  

## 2022-10-08 NOTE — ED Triage Notes (Signed)
Patient to Urgent Care with complaints of sore throat and epigastric pain x2-3 days. Describes epigastric pain as sharp and constant, states it feels similar to something being stuck. Reports hx of IBS but typically has lower pain with this. States that pain worsens when eating.   Denies any NVD. Denies any urinary symptoms.

## 2022-10-08 NOTE — ED Provider Notes (Signed)
[facility]    CSN: [id number] Arrival date & time: 10/08/22  1145      History   Chief Complaint Chief Complaint  Patient presents with   Abdominal Pain    Entered by patient    HPI Virginia Fowler is a 56 y.o. female.  Patient presents with upper abdominal pain x2 days.  The pain is primarily epigastric and periumbilical.  It is getting worse today and she feels mildly short of breath. She also reports sore throat.  She denies fever, chills, nausea, vomiting, diarrhea, constipation, blood in stool, dysuria, hematuria, chest pain, or other symptoms.  Last bowel movement this morning.  She reports history of IBS but her PCP disagreed with this diagnosis so she is not sure.   The history is provided by the patient and medical records.    Past Medical History:  Diagnosis Date   Arthritis    Asthma    Hypothyroidism    Vitamin D deficiency     Patient Active Problem List   Diagnosis Date Noted   Vitamin D deficiency    Hair loss 10/22/2016   Hypothyroidism     Past Surgical History:  Procedure Laterality Date   BREAST BIOPSY Bilateral 2012   benign    OB History   No obstetric history on file.      Home Medications    Prior to Admission medications   Medication Sig Start Date End Date Taking? Authorizing Provider  amoxicillin-clavulanate (AUGMENTIN) 875-125 MG tablet Take 1 tablet by mouth 2 (two) times daily. Patient not taking: Reported on 11/24/2019 03/02/18   Doy Mince, PA-C  benzonatate (TESSALON PERLES) 100 MG capsule Take 1 capsule (100 mg total) by mouth 3 (three) times daily as needed for cough. Patient not taking: Reported on 11/24/2019 03/02/18   Ellie Lunch R, PA-C  cyclobenzaprine (FLEXERIL) 5 MG tablet Take 1-2 tablets 3 times daily as needed Patient not taking: Reported on 11/24/2019 07/07/19   Enid Derry, PA-C  dicyclomine (BENTYL) 20 MG tablet TAKE 1 TABLET BY MOUTH THREE TIMES A DAY AS NEEDED FOR 10 DAYS 03/23/21  03/23/22  Hyacinth Meeker, [place] E, PA  ibuprofen (ADVIL) 400 MG tablet Take 1 tablet (400 mg total) by mouth every 6 (six) hours as needed. 07/07/19   Enid Derry, PA-C  levothyroxine (SYNTHROID) 50 MCG tablet TAKE ONE TABLET BY MOUTH EVERY MORNING ON AN EMPTY STOMACH 10/09/20 10/09/21  Lorenda Ishihara, MD  levothyroxine (SYNTHROID, LEVOTHROID) 50 MCG tablet Take 1 tablet by mouth daily. 01/07/18   [provider]  lidocaine (LIDODERM) 5 % Place 1 patch onto the skin daily. Remove & Discard patch within 12 hours or as directed by MD Patient not taking: Reported on 11/24/2019 07/07/19   Enid Derry, PA-C  sulfamethoxazole-trimethoprim (BACTRIM DS) 800-160 MG tablet Take 1 tablet by mouth 2 (two) times daily. 09/01/21   Bennie Pierini, FNP    Family History Family History  Problem Relation Age of Onset   Thyroid disease Neg Hx     Social History Social History   Tobacco Use   Smoking status: Never   Smokeless tobacco: Never  Substance Use Topics   Alcohol use: Yes    Alcohol/week: 7.0 standard drinks of alcohol    Types: 7 Glasses of wine per week   Drug use: No     Allergies   Patient has no known allergies.   Review of Systems Review of Systems  Constitutional:  Negative for chills and fever.  Respiratory:  Positive for shortness of breath. Negative for cough.   Cardiovascular:  Negative for chest pain and palpitations.  Gastrointestinal:  Positive for abdominal pain. Negative for blood in stool, constipation, diarrhea, nausea and vomiting.  All other systems reviewed and are negative.    Physical Exam Triage Vital Signs ED Triage Vitals  Enc Vitals Group     BP      Pulse      Resp      Temp      Temp src      SpO2      Weight      Height      Head Circumference      Peak Flow      Pain Score      Pain Loc      Pain Edu?      Excl. in GC?    No data found.  Updated Vital Signs BP 120/78   Pulse 88   Temp 98.1 F (36.7 C)   Resp 18    Ht 5\' 4"  (1.626 m)   Wt 114 lb (51.7 kg)   LMP 07/08/2016 (Approximate)   SpO2 98%   BMI 19.57 kg/m   Visual Acuity Right Eye Distance:   Left Eye Distance:   Bilateral Distance:    Right Eye Near:   Left Eye Near:    Bilateral Near:     Physical Exam Vitals and nursing note reviewed.  Constitutional:      General: She is not in acute distress.    Appearance: Normal appearance. She is well-developed. She is not ill-appearing.  HENT:     Mouth/Throat:     Mouth: Mucous membranes are moist.     Pharynx: Oropharynx is clear.  Cardiovascular:     Rate and Rhythm: Normal rate and regular rhythm.     Heart sounds: Normal heart sounds.  Pulmonary:     Effort: Pulmonary effort is normal. No respiratory distress.     Breath sounds: Normal breath sounds.  Abdominal:     General: Bowel sounds are normal. There is no distension.     Palpations: Abdomen is soft.     Tenderness: There is abdominal tenderness in the epigastric area and periumbilical area. There is no right CVA tenderness, left CVA tenderness, guarding or rebound.  Musculoskeletal:     Cervical back: Neck supple.  Skin:    General: Skin is warm and dry.  Neurological:     Mental Status: She is alert.  Psychiatric:        Mood and Affect: Mood normal.        Behavior: Behavior normal.      UC Treatments / Results  Labs (all labs ordered are listed, but only abnormal results are displayed) Labs Reviewed  POCT URINALYSIS DIP (MANUAL ENTRY) - Abnormal; Notable for the following components:      Result Value   Blood, UA small (*)    All other components within normal limits    EKG   Radiology No results found.  Procedures Procedures (including critical care time)  Medications Ordered in UC Medications - No data to display  Initial Impression / Assessment and Plan / UC Course  I have reviewed the triage vital signs and the nursing notes.  Pertinent labs & imaging results that were available  during my care of the patient were reviewed by me and considered in my medical decision making (see chart for details).    Upper abdominal pain, shortness of  breath.  Abdomen tender to palpation.  Patient reports pain is getting worse and she feels mildly short of breath.  Discussed limitations of evaluation of her symptoms in an urgent care setting.  Sending her to the ED for evaluation.  She is agreeable to this.  She feels stable to drive herself.  Afebrile and vital signs are stable.  Final Clinical Impressions(s) / UC Diagnoses   Final diagnoses:  Pain of upper abdomen  Shortness of breath     Discharge Instructions      Go to the emergency department for evaluation of your abdominal pain.      ED Prescriptions   None    PDMP not reviewed this encounter.   Sharion Balloon, NP 10/08/22 1226

## 2022-10-29 ENCOUNTER — Ambulatory Visit: Payer: BC Managed Care – PPO

## 2022-11-08 ENCOUNTER — Ambulatory Visit (HOSPITAL_COMMUNITY)
Admission: RE | Admit: 2022-11-08 | Discharge: 2022-11-08 | Disposition: A | Discharge status: Home / Self Care | Payer: BC Managed Care – PPO | Source: Ambulatory Visit | Attending: Family Medicine | Admitting: Family Medicine

## 2022-11-08 ENCOUNTER — Encounter (HOSPITAL_COMMUNITY): Payer: Self-pay

## 2022-11-08 VITALS — BP 102/53 | HR 82 | Temp 98.7°F | Resp 16

## 2022-11-08 DIAGNOSIS — Z8616 Personal history of COVID-19: Secondary | ICD-10-CM

## 2022-11-08 DIAGNOSIS — R052 Subacute cough: Secondary | ICD-10-CM | POA: Diagnosis not present

## 2022-11-08 NOTE — Discharge Instructions (Signed)
You were seen today for continued cough after having had covid-19.  Your exam was normal today.  No evidence of pneumonia or other bacterial infection. I recommend you trial over the counter mucinex at bed to see if helps your symptoms without the side affects.  If you develop fever, or worsening cough then please return for re-evaluation.

## 2022-11-08 NOTE — ED Provider Notes (Signed)
[facility]    CSN: [id number] Arrival date & time: 11/08/22  1421      History   Chief Complaint Chief Complaint  Patient presents with   Cough    Cough that won't go away - Entered by patient   appt 230p    HPI Virginia Fowler is a 56 y.o. female.   Patient is here for persistent cough.  Had covid about 2 weeks ago and still with productive cough at times. No fevers/chills.  No runny nose, congestion.  She last took mucinex about a week ago with some help, but made her feel loopy so stopped.  She denies any issues with asthma.  She thinks the cough may be improving, but her husband is concerned about the cough at night.        Past Medical History:  Diagnosis Date   Arthritis    Asthma    Hypothyroidism    Vitamin D deficiency     Patient Active Problem List   Diagnosis Date Noted   Vitamin D deficiency    Hair loss 10/22/2016   Hypothyroidism     Past Surgical History:  Procedure Laterality Date   BREAST BIOPSY Bilateral 2012   benign    OB History   No obstetric history on file.      Home Medications    Prior to Admission medications   Medication Sig Start Date End Date Taking? Authorizing Provider  amoxicillin-clavulanate (AUGMENTIN) 875-125 MG tablet Take 1 tablet by mouth 2 (two) times daily. Patient not taking: Reported on 11/24/2019 03/02/18   Doy Mince, PA-C  benzonatate (TESSALON PERLES) 100 MG capsule Take 1 capsule (100 mg total) by mouth 3 (three) times daily as needed for cough. Patient not taking: Reported on 11/24/2019 03/02/18   Ellie Lunch R, PA-C  cyclobenzaprine (FLEXERIL) 5 MG tablet Take 1-2 tablets 3 times daily as needed Patient not taking: Reported on 11/24/2019 07/07/19   Enid Derry, PA-C  dicyclomine (BENTYL) 20 MG tablet TAKE 1 TABLET BY MOUTH THREE TIMES A DAY AS NEEDED FOR 10 DAYS 03/23/21 03/23/22  Hyacinth Meeker, [place] E, PA  ibuprofen (ADVIL) 400 MG tablet Take 1 tablet (400 mg total) by mouth  every 6 (six) hours as needed. 07/07/19   Enid Derry, PA-C  levothyroxine (SYNTHROID) 50 MCG tablet TAKE ONE TABLET BY MOUTH EVERY MORNING ON AN EMPTY STOMACH 10/09/20 10/09/21  Lorenda Ishihara, MD  levothyroxine (SYNTHROID, LEVOTHROID) 50 MCG tablet Take 1 tablet by mouth daily. 01/07/18   [provider]  lidocaine (LIDODERM) 5 % Place 1 patch onto the skin daily. Remove & Discard patch within 12 hours or as directed by MD Patient not taking: Reported on 11/24/2019 07/07/19   Enid Derry, PA-C  sulfamethoxazole-trimethoprim (BACTRIM DS) 800-160 MG tablet Take 1 tablet by mouth 2 (two) times daily. 09/01/21   Bennie Pierini, FNP    Family History Family History  Problem Relation Age of Onset   Thyroid disease Neg Hx     Social History Social History   Tobacco Use   Smoking status: Never   Smokeless tobacco: Never  Substance Use Topics   Alcohol use: Yes    Alcohol/week: 7.0 standard drinks of alcohol    Types: 7 Glasses of wine per week   Drug use: No     Allergies   Patient has no known allergies.   Review of Systems Review of Systems  Constitutional: Negative.   HENT: Negative.    Respiratory:  Positive for  cough.   Gastrointestinal: Negative.   Genitourinary: Negative.   Musculoskeletal: Negative.   Neurological: Negative.   Hematological: Negative.   Psychiatric/Behavioral: Negative.       Physical Exam Triage Vital Signs ED Triage Vitals  Enc Vitals Group     BP 11/08/22 1435 (!) 102/53     Pulse Rate 11/08/22 1435 82     Resp 11/08/22 1435 16     Temp 11/08/22 1435 98.7 F (37.1 C)     Temp Source 11/08/22 1435 Oral     SpO2 11/08/22 1435 97 %     Weight --      Height --      Head Circumference --      Peak Flow --      Pain Score 11/08/22 1434 0     Pain Loc --      Pain Edu? --      Excl. in GC? --    No data found.  Updated Vital Signs BP (!) 102/53 (BP Location: Right Arm)   Pulse 82   Temp 98.7 F (37.1 C)  (Oral)   Resp 16   LMP 07/08/2016 (Approximate)   SpO2 97%   Visual Acuity Right Eye Distance:   Left Eye Distance:   Bilateral Distance:    Right Eye Near:   Left Eye Near:    Bilateral Near:     Physical Exam Constitutional:      Appearance: Normal appearance.  HENT:     Nose: No congestion or rhinorrhea.     Mouth/Throat:     Mouth: Mucous membranes are moist.  Cardiovascular:     Rate and Rhythm: Normal rate and regular rhythm.  Pulmonary:     Effort: Pulmonary effort is normal.     Breath sounds: Normal breath sounds. No wheezing.  Musculoskeletal:        General: Normal range of motion.     Cervical back: Normal range of motion and neck supple. No tenderness.  Lymphadenopathy:     Cervical: No cervical adenopathy.  Skin:    General: Skin is warm.  Neurological:     General: No focal deficit present.     Mental Status: She is alert.  Psychiatric:        Mood and Affect: Mood normal.      UC Treatments / Results  Labs (all labs ordered are listed, but only abnormal results are displayed) Labs Reviewed - No data to display  EKG   Radiology No results found.  Procedures Procedures (including critical care time)  Medications Ordered in UC Medications - No data to display  Initial Impression / Assessment and Plan / UC Course  I have reviewed the triage vital signs and the nursing notes.  Pertinent labs & imaging results that were available during my care of the patient were reviewed by me and considered in my medical decision making (see chart for details).   Final Clinical Impressions(s) / UC Diagnoses   Final diagnoses:  History of COVID-19  Subacute cough     Discharge Instructions      You were seen today for continued cough after having had covid-19.  Your exam was normal today.  No evidence of pneumonia or other bacterial infection. I recommend you trial over the counter mucinex at bed to see if helps your symptoms without the side  affects.  If you develop fever, or worsening cough then please return for re-evaluation.     ED Prescriptions   None  PDMP not reviewed this encounter.   Jannifer Franklin, MD 11/08/22 [phone]

## 2022-11-08 NOTE — ED Triage Notes (Signed)
Pt reports had covid 2-2.5 weeks ago and still having persistent cough that is sometimes productive. Was taking Mucinex but stopped when made feel loopy.

## 2022-12-14 ENCOUNTER — Ambulatory Visit (HOSPITAL_COMMUNITY)
Admission: EM | Admit: 2022-12-14 | Discharge: 2022-12-14 | Disposition: A | Discharge status: Home / Self Care | Payer: BC Managed Care – PPO | Attending: Internal Medicine | Admitting: Internal Medicine

## 2022-12-14 ENCOUNTER — Encounter (HOSPITAL_COMMUNITY): Payer: Self-pay | Admitting: Emergency Medicine

## 2022-12-14 ENCOUNTER — Ambulatory Visit (INDEPENDENT_AMBULATORY_CARE_PROVIDER_SITE_OTHER): Payer: BC Managed Care – PPO

## 2022-12-14 DIAGNOSIS — R059 Cough, unspecified: Secondary | ICD-10-CM | POA: Diagnosis not present

## 2022-12-14 DIAGNOSIS — R053 Chronic cough: Secondary | ICD-10-CM

## 2022-12-14 DIAGNOSIS — R052 Subacute cough: Secondary | ICD-10-CM

## 2022-12-14 MED ORDER — BENZONATATE 100 MG PO CAPS: 100.0000 mg | ORAL_CAPSULE | Freq: Three times a day (TID) | ORAL | 0 refills | Status: DC

## 2022-12-14 MED ORDER — FAMOTIDINE 20 MG PO TABS: 20.0000 mg | ORAL_TABLET | Freq: Two times a day (BID) | ORAL | 0 refills | Status: DC

## 2022-12-14 NOTE — ED Provider Notes (Signed)
[facility]    CSN: [id number] Arrival date & time: 12/14/22  1025      History   Chief Complaint Chief Complaint  Patient presents with   Cough    HPI Virginia Fowler is a 56 y.o. female.   Patient presents urgent care for evaluation of nonproductive/dry cough for the last 3 weeks.  Patient was sick with what she assumed to be COVID-19 in November 2023 and states that her cough at that time resolved 3 days after her visit.  3 weeks ago, patient traveled to [place] and believes she developed new viral upper respiratory illness.  At the start of her illness 3 weeks ago, she had a fever, chills, nausea, and nasal congestion.  She is no longer experiencing fever, chills, or congestion but cough has persisted.  She is also experiencing a burning sensation to the mid chest and cannot identify any triggering or relieving factors for this.  Denies history of GERD and is unsure of caffeine/coffee or spicy foods makes this worse.  Symptoms are not worse with laying flat.  She has not been taking any medicines for symptoms before coming to urgent care.  Did not retest for COVID 19 at beginning of symptoms 3 weeks ago.  No other known sick contacts with similar symptoms.  History of asthma that is well-controlled with as needed use of albuterol inhaler.  She has not needed to use albuterol since becoming ill with cough.  She is not a smoker and denies drug use.  Denies chest pain, shortness of breath, weakness, heart palpitations, wheezing, headache, fever/chills, abdominal pain, body aches, and dizziness.  No recent antibiotic or steroid use.    Cough   Past Medical History:  Diagnosis Date   Arthritis    Asthma    Hypothyroidism    Vitamin D deficiency     Patient Active Problem List   Diagnosis Date Noted   Vitamin D deficiency    Hair loss 10/22/2016   Hypothyroidism     Past Surgical History:  Procedure Laterality Date   BREAST BIOPSY Bilateral 2012   benign    OB  History   No obstetric history on file.      Home Medications    Prior to Admission medications   Medication Sig Start Date End Date Taking? Authorizing Provider  benzonatate (TESSALON) 100 MG capsule Take 1 capsule (100 mg total) by mouth every 8 (eight) hours. 12/14/22  Yes Carlisle Beers, FNP  famotidine (PEPCID) 20 MG tablet Take 1 tablet (20 mg total) by mouth 2 (two) times daily. 12/14/22  Yes Carlisle Beers, FNP  ibuprofen (ADVIL) 400 MG tablet Take 1 tablet (400 mg total) by mouth every 6 (six) hours as needed. 07/07/19   Enid Derry, PA-C  levothyroxine (SYNTHROID) 50 MCG tablet TAKE ONE TABLET BY MOUTH EVERY MORNING ON AN EMPTY STOMACH 10/09/20 10/09/21  Lorenda Ishihara, MD  levothyroxine (SYNTHROID, LEVOTHROID) 50 MCG tablet Take 1 tablet by mouth daily. 01/07/18   [provider]    Family History Family History  Problem Relation Age of Onset   Thyroid disease Neg Hx     Social History Social History   Tobacco Use   Smoking status: Never   Smokeless tobacco: Never  Substance Use Topics   Alcohol use: Yes    Alcohol/week: 7.0 standard drinks of alcohol    Types: 7 Glasses of wine per week   Drug use: No     Allergies   Patient  has no known allergies.   Review of Systems Review of Systems  Respiratory:  Positive for cough.   Per HPI   Physical Exam Triage Vital Signs ED Triage Vitals  Enc Vitals Group     BP 12/14/22 1117 113/75     Pulse Rate 12/14/22 1117 72     Resp 12/14/22 1117 15     Temp 12/14/22 1117 97.9 F (36.6 C)     Temp Source 12/14/22 1117 Oral     SpO2 12/14/22 1117 99 %     Weight --      Height --      Head Circumference --      Peak Flow --      Pain Score 12/14/22 1116 0     Pain Loc --      Pain Edu? --      Excl. in GC? --    No data found.  Updated Vital Signs BP 113/75 (BP Location: Left Arm)   Pulse 72   Temp 97.9 F (36.6 C) (Oral)   Resp 15   LMP 07/08/2016 (Approximate)    SpO2 99%   Visual Acuity Right Eye Distance:   Left Eye Distance:   Bilateral Distance:    Right Eye Near:   Left Eye Near:    Bilateral Near:     Physical Exam Vitals and nursing note reviewed.  Constitutional:      Appearance: Normal appearance. She is not ill-appearing or toxic-appearing.  HENT:     Head: Normocephalic and atraumatic.     Right Ear: Hearing, tympanic membrane, ear canal and external ear normal.     Left Ear: Hearing, tympanic membrane, ear canal and external ear normal.     Nose: Nose normal.     Mouth/Throat:     Lips: Pink.     Mouth: Mucous membranes are moist.     Pharynx: No posterior oropharyngeal erythema.     Comments: Small amount of clear postnasal drainage visualized to the posterior oropharynx.  Eyes:     General: Lids are normal. Vision grossly intact. Gaze aligned appropriately.     Extraocular Movements: Extraocular movements intact.     Conjunctiva/sclera: Conjunctivae normal.  Cardiovascular:     Rate and Rhythm: Normal rate and regular rhythm.     Heart sounds: Normal heart sounds, S1 normal and S2 normal.  Pulmonary:     Effort: Pulmonary effort is normal. No respiratory distress.     Breath sounds: Normal breath sounds and air entry.     Comments: No adventitious lung sounds heard to auscultation. Abdominal:     General: Abdomen is flat. Bowel sounds are normal.     Palpations: Abdomen is soft.     Tenderness: There is no abdominal tenderness.  Musculoskeletal:     Cervical back: Neck supple.  Lymphadenopathy:     Cervical: No cervical adenopathy.  Skin:    General: Skin is warm and dry.     Capillary Refill: Capillary refill takes less than 2 seconds.     Findings: No rash.  Neurological:     General: No focal deficit present.     Mental Status: She is alert and oriented to person, place, and time. Mental status is at baseline.     Cranial Nerves: No dysarthria or facial asymmetry.     Motor: No weakness.     Gait: Gait  normal.  Psychiatric:        Mood and Affect: Mood normal.  Speech: Speech normal.        Behavior: Behavior normal.        Thought Content: Thought content normal.        Judgment: Judgment normal.      UC Treatments / Results  Labs (all labs ordered are listed, but only abnormal results are displayed) Labs Reviewed - No data to display  EKG   Radiology DG Chest 2 View  Result Date: 12/14/2022 CLINICAL DATA:  Cough for 6 weeks. EXAM: CHEST - 2 VIEW COMPARISON:  12/31/2015. FINDINGS: Cardiac silhouette is normal in size and configuration. Normal mediastinal and hilar contours. Clear lungs.  No pleural effusion or pneumothorax. Skeletal structures are unremarkable. IMPRESSION: No active cardiopulmonary disease. Electronically Signed   By: Amie Portland M.D.   On: 12/14/2022 11:45    Procedures Procedures (including critical care time)  Medications Ordered in UC Medications - No data to display  Initial Impression / Assessment and Plan / UC Course  I have reviewed the triage vital signs and the nursing notes.  Pertinent labs & imaging results that were available during my care of the patient were reviewed by me and considered in my medical decision making (see chart for details).   1.  Subacute cough, persistent cough for the weeks or longer Unclear etiology of patient's cough.  Suspect either GERD versus postviral cough.  We will treat for both.  Chest x-ray is clear and without signs of cardiopulmonary process.  Lungs are clear to auscultation, patient is nontoxic in appearance and is not in any acute respiratory distress.  May attempt use of Tessalon Perles every 8 hours as needed for cough.  May take famotidine twice daily with breakfast and before dinner and attempt to help with burning sensation in the chest and acid reflux that may be contributing to cough.  She is advised to will avoid spicy foods and acidic foods over the next few weeks.  She has been advised to sit  up for at least 2 hours after eating to allow body to empty stomach fully prior to laying flat and attempt to helping cough.  No indication for antibiotic therapy or steroid today as I do not hear any wheezing and she is not congested.  She is agreeable with plan.  PCP follow-up in the next 2 to 3 weeks recommended.   Discussed physical exam and available lab work findings in clinic with patient.  Counseled patient regarding appropriate use of medications and potential side effects for all medications recommended or prescribed today. Discussed red flag signs and symptoms of worsening condition,when to call the PCP office, return to urgent care, and when to seek higher level of care in the emergency department. Patient verbalizes understanding and agreement with plan. All questions answered. Patient discharged in stable condition.    Final Clinical Impressions(s) / UC Diagnoses   Final diagnoses:  Acute cough  Persistent cough for 3 weeks or longer     Discharge Instructions      Take tessalon perles every 8 hours as needed for cough. Take famotidine twice daily (with breakfast and before dinner) and attempt to help with burning sensation in the chest and also cough that may be related to acid reflux.  Attempt to limit the amount of spicy, acidic, and caffeinated foods/beverages over the next few weeks.  Sit up for 2 hours prior to laying down after eating to allow your body to empty your stomach before you lay down, this may help with your cough  as well.  If you develop any new or worsening symptoms or do not improve in the next 2 to 3 days, please return.  If your symptoms are severe, please go to the emergency room.  Follow-up with your primary care provider for further evaluation and management of your symptoms as well as ongoing wellness visits.  I hope you feel better!    ED Prescriptions     Medication Sig Dispense Auth. Provider   benzonatate (TESSALON) 100 MG capsule Take 1  capsule (100 mg total) by mouth every 8 (eight) hours. 21 capsule Carlisle BeersStanhope, Drayce Tawil M, FNP   famotidine (PEPCID) 20 MG tablet Take 1 tablet (20 mg total) by mouth 2 (two) times daily. 30 tablet Carlisle BeersStanhope, Normon Pettijohn M, FNP      PDMP not reviewed this encounter.   Carlisle BeersStanhope, Parminder Trapani M, [place]NP 12/14/22 1212

## 2022-12-14 NOTE — Discharge Instructions (Signed)
Take tessalon perles every 8 hours as needed for cough. Take famotidine twice daily (with breakfast and before dinner) and attempt to help with burning sensation in the chest and also cough that may be related to acid reflux.  Attempt to limit the amount of spicy, acidic, and caffeinated foods/beverages over the next few weeks.  Sit up for 2 hours prior to laying down after eating to allow your body to empty your stomach before you lay down, this may help with your cough as well.  If you develop any new or worsening symptoms or do not improve in the next 2 to 3 days, please return.  If your symptoms are severe, please go to the emergency room.  Follow-up with your primary care provider for further evaluation and management of your symptoms as well as ongoing wellness visits.  I hope you feel better!

## 2022-12-14 NOTE — ED Triage Notes (Signed)
Pt reports having cough for 6 weeks. Cough is non-productive. Not taking any medications for cough.

## 2022-12-27 ENCOUNTER — Ambulatory Visit
Admission: RE | Admit: 2022-12-27 | Discharge: 2022-12-27 | Disposition: A | Discharge status: Home / Self Care | Payer: BC Managed Care – PPO | Source: Ambulatory Visit | Attending: Internal Medicine | Admitting: Internal Medicine

## 2022-12-27 DIAGNOSIS — Z1231 Encounter for screening mammogram for malignant neoplasm of breast: Secondary | ICD-10-CM | POA: Diagnosis not present

## 2023-01-07 DIAGNOSIS — Z0000 Encounter for general adult medical examination without abnormal findings: Secondary | ICD-10-CM | POA: Diagnosis not present

## 2023-01-07 DIAGNOSIS — E559 Vitamin D deficiency, unspecified: Secondary | ICD-10-CM | POA: Diagnosis not present

## 2023-01-07 DIAGNOSIS — J4520 Mild intermittent asthma, uncomplicated: Secondary | ICD-10-CM | POA: Diagnosis not present

## 2023-01-07 DIAGNOSIS — E039 Hypothyroidism, unspecified: Secondary | ICD-10-CM | POA: Diagnosis not present

## 2023-01-07 DIAGNOSIS — Z13220 Encounter for screening for lipoid disorders: Secondary | ICD-10-CM | POA: Diagnosis not present

## 2023-01-07 DIAGNOSIS — N952 Postmenopausal atrophic vaginitis: Secondary | ICD-10-CM | POA: Diagnosis not present

## 2023-01-07 DIAGNOSIS — Z23 Encounter for immunization: Secondary | ICD-10-CM | POA: Diagnosis not present

## 2023-01-07 DIAGNOSIS — M810 Age-related osteoporosis without current pathological fracture: Secondary | ICD-10-CM | POA: Diagnosis not present

## 2023-01-16 DIAGNOSIS — H6123 Impacted cerumen, bilateral: Secondary | ICD-10-CM | POA: Diagnosis not present

## 2023-04-21 DIAGNOSIS — Z86010 Personal history of colonic polyps: Secondary | ICD-10-CM | POA: Diagnosis not present

## 2023-04-21 DIAGNOSIS — K635 Polyp of colon: Secondary | ICD-10-CM | POA: Diagnosis not present

## 2023-04-21 DIAGNOSIS — K648 Other hemorrhoids: Secondary | ICD-10-CM | POA: Diagnosis not present

## 2023-04-21 DIAGNOSIS — K5730 Diverticulosis of large intestine without perforation or abscess without bleeding: Secondary | ICD-10-CM | POA: Diagnosis not present

## 2023-08-18 ENCOUNTER — Other Ambulatory Visit: Payer: Self-pay

## 2023-08-18 ENCOUNTER — Encounter: Payer: Self-pay | Admitting: Physician Assistant

## 2023-08-18 ENCOUNTER — Ambulatory Visit (INDEPENDENT_AMBULATORY_CARE_PROVIDER_SITE_OTHER): Payer: Self-pay | Admitting: Physician Assistant

## 2023-08-18 VITALS — BP 112/76 | HR 69 | Temp 97.1°F | Ht 64.0 in | Wt 117.0 lb

## 2023-08-18 DIAGNOSIS — J069 Acute upper respiratory infection, unspecified: Secondary | ICD-10-CM

## 2023-08-18 DIAGNOSIS — R051 Acute cough: Secondary | ICD-10-CM

## 2023-08-18 LAB — POC COVID19 BINAXNOW: SARS Coronavirus 2 Ag: NEGATIVE

## 2023-08-18 MED ORDER — BENZONATATE 200 MG PO CAPS: 200.0000 mg | ORAL_CAPSULE | Freq: Three times a day (TID) | ORAL | 0 refills | Status: DC | PRN

## 2023-08-18 MED ORDER — ALBUTEROL SULFATE HFA 108 (90 BASE) MCG/ACT IN AERS: 2.0000 | INHALATION_SPRAY | Freq: Four times a day (QID) | RESPIRATORY_TRACT | 1 refills | Status: AC | PRN

## 2023-08-18 NOTE — Progress Notes (Signed)
[facility] [address] [place], [place] [address]   Office Visit Note  Patient Name: Virginia Fowler Date of Birth [birth year]  Medical Record number [id number]  Date of Service: 08/18/2023  Chief Complaint  Patient presents with   Acute Visit    Patient states she visited with her sister about 2 weeks ago after she returned from [place]. Her sister was sick at the time and patient has not been feeling well since then. Patient c/o lightheadedness, sore throat, dry cough/wheezing, and states she feels hot but has not had a fever. She has not tested at home for COVID.      57 y/o F presents to the clinic for c/o cough, light-headed, and sore throat, and wheezing with coughing fits x few days. Pt denies known exposure to covid. However, also feels achy. Had been exposed to her sister traveling from [place] two weeks ago and had cough, but progressively getting worse with wheezing.       Current Medication:  Outpatient Encounter Medications as of 08/18/2023  Medication Sig   albuterol (VENTOLIN HFA) 108 (90 Base) MCG/ACT inhaler Inhale 2 puffs into the lungs every 6 (six) hours as needed for wheezing or shortness of breath.   benzonatate (TESSALON) 200 MG capsule Take 1 capsule (200 mg total) by mouth 3 (three) times daily as needed for cough.   ibuprofen (ADVIL) 400 MG tablet Take 1 tablet (400 mg total) by mouth every 6 (six) hours as needed.   levothyroxine (SYNTHROID) 50 MCG tablet TAKE ONE TABLET BY MOUTH EVERY MORNING ON AN EMPTY STOMACH   [DISCONTINUED] benzonatate (TESSALON) 100 MG capsule Take 1 capsule (100 mg total) by mouth every 8 (eight) hours.   [DISCONTINUED] famotidine (PEPCID) 20 MG tablet Take 1 tablet (20 mg total) by mouth 2 (two) times daily.   [DISCONTINUED] levothyroxine (SYNTHROID, LEVOTHROID) 50 MCG tablet Take 1 tablet by mouth daily.   No facility-administered encounter medications on file as of 08/18/2023.      Medical History: Past Medical  History:  Diagnosis Date   Arthritis    Asthma    Hypothyroidism    Vitamin D deficiency      Vital Signs: BP 112/76   Pulse 69   Temp (!) 97.1 F (36.2 C)   Ht 5\' 4"  (1.626 m)   Wt 117 lb (53.1 kg)   LMP 07/08/2016 (Approximate)   SpO2 99%   BMI 20.08 kg/m    Review of Systems  Constitutional:  Positive for fatigue. Negative for activity change and chills.  HENT:  Positive for congestion, ear pain and sore throat. Negative for sinus pressure, sinus pain and trouble swallowing.   Respiratory:  Positive for cough and wheezing. Negative for chest tightness and shortness of breath.   Cardiovascular: Negative.   Neurological:  Positive for light-headedness. Negative for dizziness and headaches.    Physical Exam Constitutional:      Appearance: Normal appearance.  HENT:     Head: Atraumatic.     Right Ear: Tympanic membrane, ear canal and external ear normal.     Left Ear: Tympanic membrane, ear canal and external ear normal.     Ears:     Comments: R TM appears dull     Nose: Nose normal.     Mouth/Throat:     Mouth: Mucous membranes are moist.     Pharynx: Oropharynx is clear.  Eyes:     Extraocular Movements: Extraocular movements intact.  Cardiovascular:  Rate and Rhythm: Normal rate and regular rhythm.  Pulmonary:     Effort: Pulmonary effort is normal.     Breath sounds: Normal breath sounds.  Musculoskeletal:     Cervical back: Neck supple.  Skin:    General: Skin is warm.  Neurological:     Mental Status: She is alert.  Psychiatric:        Mood and Affect: Mood normal.        Behavior: Behavior normal.        Thought Content: Thought content normal.        Judgment: Judgment normal.       Assessment/Plan:  1. Viral upper respiratory tract infection  2. Acute cough - albuterol (VENTOLIN HFA) 108 (90 Base) MCG/ACT inhaler; Inhale 2 puffs into the lungs every 6 (six) hours as needed for wheezing or shortness of breath.  Dispense: 8 g; Refill:  1 - benzonatate (TESSALON) 200 MG capsule; Take 1 capsule (200 mg total) by mouth 3 (three) times daily as needed for cough.  Dispense: 30 capsule; Refill: 0 - POC COVID-19 BinaxNow  Reviewed negative rapid covid test with patient. Reviewed my clinical findings with patient.  She verbalized understanding. Increase fluids Take medicine as prescribed Take otc oral anti-histamine  Re-test for covid at home in 2-3 days Wear a mask  General Counseling: elvia brosch understanding of the findings of todays visit and agrees with plan of treatment. I have discussed any further diagnostic evaluation that may be needed or ordered today. We also reviewed her medications today. she has been encouraged to call the office with any questions or concerns that should arise related to todays visit.    Time spent:30 Minutes    Gilberto Better, [place] Physician Assistant

## 2023-09-12 DIAGNOSIS — J4520 Mild intermittent asthma, uncomplicated: Secondary | ICD-10-CM | POA: Diagnosis not present

## 2023-09-12 DIAGNOSIS — Z23 Encounter for immunization: Secondary | ICD-10-CM | POA: Diagnosis not present

## 2023-09-12 DIAGNOSIS — M810 Age-related osteoporosis without current pathological fracture: Secondary | ICD-10-CM | POA: Diagnosis not present

## 2023-09-15 ENCOUNTER — Other Ambulatory Visit: Payer: Self-pay | Admitting: Internal Medicine

## 2023-09-15 DIAGNOSIS — M810 Age-related osteoporosis without current pathological fracture: Secondary | ICD-10-CM

## 2023-11-11 DIAGNOSIS — L821 Other seborrheic keratosis: Secondary | ICD-10-CM | POA: Diagnosis not present

## 2023-11-11 DIAGNOSIS — J4520 Mild intermittent asthma, uncomplicated: Secondary | ICD-10-CM | POA: Diagnosis not present

## 2023-12-31 ENCOUNTER — Ambulatory Visit: Payer: Self-pay | Admitting: Adult Health

## 2023-12-31 ENCOUNTER — Other Ambulatory Visit: Payer: Self-pay

## 2023-12-31 ENCOUNTER — Encounter: Payer: Self-pay | Admitting: Adult Health

## 2023-12-31 VITALS — BP 110/70 | HR 77 | Temp 97.1°F | Ht 64.0 in | Wt 118.0 lb

## 2023-12-31 DIAGNOSIS — R051 Acute cough: Secondary | ICD-10-CM

## 2023-12-31 MED ORDER — BENZONATATE 100 MG PO CAPS: 100.0000 mg | ORAL_CAPSULE | Freq: Three times a day (TID) | ORAL | 0 refills | Status: AC | PRN

## 2023-12-31 MED ORDER — AZITHROMYCIN 250 MG PO TABS: ORAL_TABLET | ORAL | 0 refills | Status: AC

## 2023-12-31 NOTE — Progress Notes (Signed)
 [facility] [address] [place], [place] [address]   Office Visit Note  Patient Name: Virginia Fowler Date of Birth [id number]  Medical Record number [id number]  Date of Service: 12/31/2023  Chief Complaint  Patient presents with   Cough    Patient c/o non-productive cough x 2 weeks. When she coughs she feels a burning sensation in her chest. She has been using her albuterol  inhaler about once a week. Cough has seemed to become more persistent over the last few days. She is leaving to go to [place] for vacation next week.     HPI Pt is here for a sick visit. She reports a cough that started about 2 weeks ago.  She thought it went away, but it returned about Jan1.  The cough ebbed a little, but now it has returned over the last few days.  She has an inhaler, but has not used it in the last few days.    Current Medication:  Outpatient Encounter Medications as of 12/31/2023  Medication Sig   albuterol  (VENTOLIN  HFA) 108 (90 Base) MCG/ACT inhaler Inhale 2 puffs into the lungs every 6 (six) hours as needed for wheezing or shortness of breath.   azithromycin  (ZITHROMAX ) 250 MG tablet Take 2 tablets on day 1, then 1 tablet daily on days 2 through 5   benzonatate  (TESSALON ) 100 MG capsule Take 1-2 capsules (100-200 mg total) by mouth 3 (three) times daily as needed for cough.   ibuprofen  (ADVIL ) 400 MG tablet Take 1 tablet (400 mg total) by mouth every 6 (six) hours as needed.   levothyroxine  (SYNTHROID ) 50 MCG tablet TAKE ONE TABLET BY MOUTH EVERY MORNING ON AN EMPTY STOMACH   [DISCONTINUED] benzonatate  (TESSALON ) 200 MG capsule Take 1 capsule (200 mg total) by mouth 3 (three) times daily as needed for cough.   No facility-administered encounter medications on file as of 12/31/2023.      Medical History: Past Medical History:  Diagnosis Date   Arthritis    Asthma    Hypothyroidism    Vitamin D deficiency      Vital Signs: BP 110/70   Pulse 77   Temp (!) 97.1 F (36.2 C)  (Tympanic)   Ht 5' 4 (1.626 m)   Wt 118 lb (53.5 kg)   LMP 07/08/2016 (Approximate)   SpO2 99%   BMI 20.25 kg/m    Review of Systems  Constitutional:  Negative for chills, fatigue and fever.  HENT:  Negative for congestion, sinus pressure, sinus pain and sore throat.   Eyes:  Negative for pain and itching.  Respiratory:  Positive for cough. Negative for shortness of breath and wheezing.   Cardiovascular:  Negative for chest pain.    Physical Exam Vitals reviewed.  Constitutional:      General: She is not in acute distress.    Appearance: She is normal weight. She is not ill-appearing.  HENT:     Head: Normocephalic.     Right Ear: Tympanic membrane and ear canal normal.     Left Ear: Tympanic membrane and ear canal normal.     Nose: Nose normal.     Mouth/Throat:     Mouth: Mucous membranes are dry.  Eyes:     General:        Right eye: No discharge.        Left eye: No discharge.     Pupils: Pupils are equal, round, and reactive to light.  Cardiovascular:     Rate  and Rhythm: Normal rate and regular rhythm.     Pulses: Normal pulses.     Heart sounds: No murmur heard.    No friction rub.  Pulmonary:     Effort: Pulmonary effort is normal. No respiratory distress.     Breath sounds: Normal breath sounds. No wheezing.  Musculoskeletal:     Cervical back: Normal range of motion. No rigidity.  Lymphadenopathy:     Cervical: No cervical adenopathy.  Neurological:     General: No focal deficit present.     Mental Status: She is alert and oriented to person, place, and time.  Psychiatric:        Mood and Affect: Mood normal.        Behavior: Behavior normal.    Assessment/Plan: 1. Acute cough (Primary) Take tessalon  and use inhaler (previous) as discussed.  Take zpak and follow up as needed.  - benzonatate  (TESSALON ) 100 MG capsule; Take 1-2 capsules (100-200 mg total) by mouth 3 (three) times daily as needed for cough.  Dispense: 30 capsule; Refill: 0 -  azithromycin  (ZITHROMAX ) 250 MG tablet; Take 2 tablets on day 1, then 1 tablet daily on days 2 through 5  Dispense: 6 tablet; Refill: 0     General Counseling: Virginia Fowler understanding of the findings of todays visit and agrees with plan of treatment. I have discussed any further diagnostic evaluation that may be needed or ordered today. We also reviewed her medications today. she has been encouraged to call the office with any questions or concerns that should arise related to todays visit.   No orders of the defined types were placed in this encounter.   Meds ordered this encounter  Medications   benzonatate  (TESSALON ) 100 MG capsule    Sig: Take 1-2 capsules (100-200 mg total) by mouth 3 (three) times daily as needed for cough.    Dispense:  30 capsule    Refill:  0   azithromycin  (ZITHROMAX ) 250 MG tablet    Sig: Take 2 tablets on day 1, then 1 tablet daily on days 2 through 5    Dispense:  6 tablet    Refill:  0    Time spent:20 Minutes    Virginia Fowler AGNP-C Nurse Practitioner

## 2024-01-09 ENCOUNTER — Other Ambulatory Visit: Payer: Self-pay | Admitting: Internal Medicine

## 2024-01-09 DIAGNOSIS — Z1231 Encounter for screening mammogram for malignant neoplasm of breast: Secondary | ICD-10-CM

## 2024-01-23 ENCOUNTER — Ambulatory Visit
Admission: RE | Admit: 2024-01-23 | Discharge: 2024-01-23 | Disposition: A | Discharge status: Home / Self Care | Payer: BC Managed Care – PPO | Source: Ambulatory Visit | Attending: Internal Medicine | Admitting: Internal Medicine

## 2024-01-23 DIAGNOSIS — Z1231 Encounter for screening mammogram for malignant neoplasm of breast: Secondary | ICD-10-CM

## 2024-01-26 DIAGNOSIS — E039 Hypothyroidism, unspecified: Secondary | ICD-10-CM | POA: Diagnosis not present

## 2024-01-26 DIAGNOSIS — Z0000 Encounter for general adult medical examination without abnormal findings: Secondary | ICD-10-CM | POA: Diagnosis not present

## 2024-01-26 DIAGNOSIS — J4520 Mild intermittent asthma, uncomplicated: Secondary | ICD-10-CM | POA: Diagnosis not present

## 2024-01-26 DIAGNOSIS — M810 Age-related osteoporosis without current pathological fracture: Secondary | ICD-10-CM | POA: Diagnosis not present

## 2024-03-31 ENCOUNTER — Other Ambulatory Visit: Payer: BC Managed Care – PPO

## 2024-04-28 DIAGNOSIS — R350 Frequency of micturition: Secondary | ICD-10-CM | POA: Diagnosis not present

## 2024-05-05 DIAGNOSIS — N958 Other specified menopausal and perimenopausal disorders: Secondary | ICD-10-CM | POA: Diagnosis not present

## 2024-05-05 DIAGNOSIS — N898 Other specified noninflammatory disorders of vagina: Secondary | ICD-10-CM | POA: Diagnosis not present

## 2024-06-09 ENCOUNTER — Other Ambulatory Visit: Payer: Self-pay | Admitting: Internal Medicine

## 2024-06-09 DIAGNOSIS — M810 Age-related osteoporosis without current pathological fracture: Secondary | ICD-10-CM

## 2024-07-13 DIAGNOSIS — N958 Other specified menopausal and perimenopausal disorders: Secondary | ICD-10-CM | POA: Diagnosis not present

## 2024-08-27 ENCOUNTER — Other Ambulatory Visit: Payer: BC Managed Care – PPO

## 2024-10-14 DIAGNOSIS — Z01419 Encounter for gynecological examination (general) (routine) without abnormal findings: Secondary | ICD-10-CM | POA: Diagnosis not present

## 2024-10-14 DIAGNOSIS — Z124 Encounter for screening for malignant neoplasm of cervix: Secondary | ICD-10-CM | POA: Diagnosis not present

## 2024-10-14 DIAGNOSIS — N958 Other specified menopausal and perimenopausal disorders: Secondary | ICD-10-CM | POA: Diagnosis not present

## 2024-12-20 ENCOUNTER — Other Ambulatory Visit: Payer: Self-pay | Admitting: Internal Medicine

## 2024-12-20 DIAGNOSIS — Z1231 Encounter for screening mammogram for malignant neoplasm of breast: Secondary | ICD-10-CM

## 2025-01-06 ENCOUNTER — Ambulatory Visit (HOSPITAL_BASED_OUTPATIENT_CLINIC_OR_DEPARTMENT_OTHER)
Admission: RE | Admit: 2025-01-06 | Discharge: 2025-01-06 | Disposition: A | Discharge status: Home / Self Care | Source: Ambulatory Visit | Attending: Internal Medicine | Admitting: Internal Medicine

## 2025-01-06 DIAGNOSIS — M810 Age-related osteoporosis without current pathological fracture: Secondary | ICD-10-CM | POA: Diagnosis present

## 2025-01-24 ENCOUNTER — Ambulatory Visit

## 2025-02-02 ENCOUNTER — Ambulatory Visit

## 2025-02-10 ENCOUNTER — Ambulatory Visit: Admitting: Family Medicine
